# Patient Record
Sex: Female | Born: 1960
Health system: Southern US, Community
[De-identification: ages and names within clinical notes are randomized; demographics above are authoritative.]

## PROBLEM LIST (undated history)

## (undated) DIAGNOSIS — E785 Hyperlipidemia, unspecified: Secondary | ICD-10-CM

## (undated) DIAGNOSIS — K219 Gastro-esophageal reflux disease without esophagitis: Secondary | ICD-10-CM

## (undated) DIAGNOSIS — I10 Essential (primary) hypertension: Secondary | ICD-10-CM

## (undated) DIAGNOSIS — J309 Allergic rhinitis, unspecified: Secondary | ICD-10-CM

## (undated) DIAGNOSIS — T7840XA Allergy, unspecified, initial encounter: Secondary | ICD-10-CM

## (undated) HISTORY — DX: Allergic rhinitis, unspecified: J30.9

## (undated) HISTORY — DX: Hyperlipidemia, unspecified: E78.5

## (undated) HISTORY — DX: Essential (primary) hypertension: I10

## (undated) HISTORY — DX: Gastro-esophageal reflux disease without esophagitis: K21.9

## (undated) HISTORY — DX: Allergy, unspecified, initial encounter: T78.40XA

---

## 1987-09-04 HISTORY — PX: ENDOMETRIAL ABLATION: SHX621

## 1998-10-26 ENCOUNTER — Other Ambulatory Visit: Admission: RE | Admit: 1998-10-26 | Discharge: 1998-10-26 | Payer: Self-pay | Admitting: Obstetrics and Gynecology

## 2004-04-18 ENCOUNTER — Other Ambulatory Visit: Admission: RE | Admit: 2004-04-18 | Discharge: 2004-04-18 | Payer: Self-pay | Admitting: Obstetrics and Gynecology

## 2016-02-09 DIAGNOSIS — H60313 Diffuse otitis externa, bilateral: Secondary | ICD-10-CM | POA: Diagnosis not present

## 2016-02-09 DIAGNOSIS — R1013 Epigastric pain: Secondary | ICD-10-CM | POA: Diagnosis not present

## 2016-02-09 DIAGNOSIS — J301 Allergic rhinitis due to pollen: Secondary | ICD-10-CM | POA: Diagnosis not present

## 2016-02-14 DIAGNOSIS — R1013 Epigastric pain: Secondary | ICD-10-CM | POA: Diagnosis not present

## 2016-02-23 DIAGNOSIS — R1013 Epigastric pain: Secondary | ICD-10-CM | POA: Diagnosis not present

## 2017-09-16 DIAGNOSIS — Z23 Encounter for immunization: Secondary | ICD-10-CM | POA: Diagnosis not present

## 2017-10-07 DIAGNOSIS — J189 Pneumonia, unspecified organism: Secondary | ICD-10-CM | POA: Diagnosis not present

## 2017-10-07 DIAGNOSIS — Z6830 Body mass index (BMI) 30.0-30.9, adult: Secondary | ICD-10-CM | POA: Diagnosis not present

## 2017-10-07 DIAGNOSIS — F17210 Nicotine dependence, cigarettes, uncomplicated: Secondary | ICD-10-CM | POA: Diagnosis not present

## 2017-10-10 DIAGNOSIS — T7840XA Allergy, unspecified, initial encounter: Secondary | ICD-10-CM | POA: Diagnosis not present

## 2017-10-10 DIAGNOSIS — J189 Pneumonia, unspecified organism: Secondary | ICD-10-CM | POA: Diagnosis not present

## 2017-10-10 DIAGNOSIS — Z6830 Body mass index (BMI) 30.0-30.9, adult: Secondary | ICD-10-CM | POA: Diagnosis not present

## 2017-11-11 DIAGNOSIS — M25562 Pain in left knee: Secondary | ICD-10-CM | POA: Diagnosis not present

## 2017-11-11 DIAGNOSIS — S6991XA Unspecified injury of right wrist, hand and finger(s), initial encounter: Secondary | ICD-10-CM | POA: Diagnosis not present

## 2017-11-11 DIAGNOSIS — R079 Chest pain, unspecified: Secondary | ICD-10-CM | POA: Diagnosis not present

## 2017-11-11 DIAGNOSIS — S299XXA Unspecified injury of thorax, initial encounter: Secondary | ICD-10-CM | POA: Diagnosis not present

## 2017-11-11 DIAGNOSIS — S8992XA Unspecified injury of left lower leg, initial encounter: Secondary | ICD-10-CM | POA: Diagnosis not present

## 2017-11-11 DIAGNOSIS — M542 Cervicalgia: Secondary | ICD-10-CM | POA: Diagnosis not present

## 2017-11-11 DIAGNOSIS — M79641 Pain in right hand: Secondary | ICD-10-CM | POA: Diagnosis not present

## 2017-11-11 DIAGNOSIS — S199XXA Unspecified injury of neck, initial encounter: Secondary | ICD-10-CM | POA: Diagnosis not present

## 2017-11-11 DIAGNOSIS — S6292XA Unspecified fracture of left wrist and hand, initial encounter for closed fracture: Secondary | ICD-10-CM | POA: Diagnosis not present

## 2017-11-11 DIAGNOSIS — S62347A Nondisplaced fracture of base of fifth metacarpal bone. left hand, initial encounter for closed fracture: Secondary | ICD-10-CM | POA: Diagnosis not present

## 2017-11-11 DIAGNOSIS — S62306A Unspecified fracture of fifth metacarpal bone, right hand, initial encounter for closed fracture: Secondary | ICD-10-CM | POA: Diagnosis not present

## 2017-11-12 DIAGNOSIS — S63501A Unspecified sprain of right wrist, initial encounter: Secondary | ICD-10-CM | POA: Diagnosis not present

## 2017-11-12 DIAGNOSIS — S62347A Nondisplaced fracture of base of fifth metacarpal bone. left hand, initial encounter for closed fracture: Secondary | ICD-10-CM | POA: Diagnosis not present

## 2017-12-10 DIAGNOSIS — S62347A Nondisplaced fracture of base of fifth metacarpal bone. left hand, initial encounter for closed fracture: Secondary | ICD-10-CM | POA: Diagnosis not present

## 2017-12-10 DIAGNOSIS — S63501A Unspecified sprain of right wrist, initial encounter: Secondary | ICD-10-CM | POA: Diagnosis not present

## 2017-12-11 DIAGNOSIS — M25632 Stiffness of left wrist, not elsewhere classified: Secondary | ICD-10-CM | POA: Diagnosis not present

## 2017-12-11 DIAGNOSIS — M79642 Pain in left hand: Secondary | ICD-10-CM | POA: Diagnosis not present

## 2017-12-11 DIAGNOSIS — M25531 Pain in right wrist: Secondary | ICD-10-CM | POA: Diagnosis not present

## 2017-12-31 DIAGNOSIS — S62347A Nondisplaced fracture of base of fifth metacarpal bone. left hand, initial encounter for closed fracture: Secondary | ICD-10-CM | POA: Diagnosis not present

## 2017-12-31 DIAGNOSIS — S63501A Unspecified sprain of right wrist, initial encounter: Secondary | ICD-10-CM | POA: Diagnosis not present

## 2018-01-02 DIAGNOSIS — R600 Localized edema: Secondary | ICD-10-CM | POA: Diagnosis not present

## 2018-01-02 DIAGNOSIS — M79644 Pain in right finger(s): Secondary | ICD-10-CM | POA: Diagnosis not present

## 2018-01-03 DIAGNOSIS — S60221A Contusion of right hand, initial encounter: Secondary | ICD-10-CM | POA: Diagnosis not present

## 2018-01-21 DIAGNOSIS — S62347A Nondisplaced fracture of base of fifth metacarpal bone. left hand, initial encounter for closed fracture: Secondary | ICD-10-CM | POA: Diagnosis not present

## 2018-02-24 DIAGNOSIS — J181 Lobar pneumonia, unspecified organism: Secondary | ICD-10-CM | POA: Diagnosis not present

## 2018-02-24 DIAGNOSIS — Z6832 Body mass index (BMI) 32.0-32.9, adult: Secondary | ICD-10-CM | POA: Diagnosis not present

## 2018-03-04 DIAGNOSIS — Z87891 Personal history of nicotine dependence: Secondary | ICD-10-CM | POA: Diagnosis not present

## 2018-03-04 DIAGNOSIS — J180 Bronchopneumonia, unspecified organism: Secondary | ICD-10-CM | POA: Diagnosis not present

## 2018-03-04 DIAGNOSIS — Z6832 Body mass index (BMI) 32.0-32.9, adult: Secondary | ICD-10-CM | POA: Diagnosis not present

## 2018-03-04 DIAGNOSIS — R05 Cough: Secondary | ICD-10-CM | POA: Diagnosis not present

## 2018-03-04 DIAGNOSIS — R5383 Other fatigue: Secondary | ICD-10-CM | POA: Diagnosis not present

## 2018-03-04 DIAGNOSIS — S62347A Nondisplaced fracture of base of fifth metacarpal bone. left hand, initial encounter for closed fracture: Secondary | ICD-10-CM | POA: Diagnosis not present

## 2018-03-04 DIAGNOSIS — K219 Gastro-esophageal reflux disease without esophagitis: Secondary | ICD-10-CM | POA: Diagnosis not present

## 2018-03-04 DIAGNOSIS — J301 Allergic rhinitis due to pollen: Secondary | ICD-10-CM | POA: Diagnosis not present

## 2018-03-11 DIAGNOSIS — K219 Gastro-esophageal reflux disease without esophagitis: Secondary | ICD-10-CM | POA: Diagnosis not present

## 2018-03-11 DIAGNOSIS — J301 Allergic rhinitis due to pollen: Secondary | ICD-10-CM | POA: Diagnosis not present

## 2018-03-11 DIAGNOSIS — J180 Bronchopneumonia, unspecified organism: Secondary | ICD-10-CM | POA: Diagnosis not present

## 2018-03-11 DIAGNOSIS — R5383 Other fatigue: Secondary | ICD-10-CM | POA: Diagnosis not present

## 2018-08-19 DIAGNOSIS — J329 Chronic sinusitis, unspecified: Secondary | ICD-10-CM | POA: Diagnosis not present

## 2018-08-19 DIAGNOSIS — R5383 Other fatigue: Secondary | ICD-10-CM | POA: Diagnosis not present

## 2018-08-19 DIAGNOSIS — Z79899 Other long term (current) drug therapy: Secondary | ICD-10-CM | POA: Diagnosis not present

## 2018-08-19 DIAGNOSIS — I10 Essential (primary) hypertension: Secondary | ICD-10-CM | POA: Diagnosis not present

## 2018-08-19 DIAGNOSIS — J9801 Acute bronchospasm: Secondary | ICD-10-CM | POA: Diagnosis not present

## 2018-08-19 DIAGNOSIS — Z6834 Body mass index (BMI) 34.0-34.9, adult: Secondary | ICD-10-CM | POA: Diagnosis not present

## 2018-08-19 DIAGNOSIS — E785 Hyperlipidemia, unspecified: Secondary | ICD-10-CM | POA: Diagnosis not present

## 2018-11-20 DIAGNOSIS — J40 Bronchitis, not specified as acute or chronic: Secondary | ICD-10-CM | POA: Diagnosis not present

## 2018-11-20 DIAGNOSIS — Z6832 Body mass index (BMI) 32.0-32.9, adult: Secondary | ICD-10-CM | POA: Diagnosis not present

## 2018-11-20 DIAGNOSIS — I10 Essential (primary) hypertension: Secondary | ICD-10-CM | POA: Diagnosis not present

## 2018-11-20 DIAGNOSIS — J329 Chronic sinusitis, unspecified: Secondary | ICD-10-CM | POA: Diagnosis not present

## 2019-02-27 DIAGNOSIS — R300 Dysuria: Secondary | ICD-10-CM | POA: Diagnosis not present

## 2019-06-17 DIAGNOSIS — I10 Essential (primary) hypertension: Secondary | ICD-10-CM | POA: Diagnosis not present

## 2019-06-17 DIAGNOSIS — Z23 Encounter for immunization: Secondary | ICD-10-CM | POA: Diagnosis not present

## 2019-06-17 DIAGNOSIS — M25549 Pain in joints of unspecified hand: Secondary | ICD-10-CM | POA: Diagnosis not present

## 2019-06-17 DIAGNOSIS — N952 Postmenopausal atrophic vaginitis: Secondary | ICD-10-CM | POA: Diagnosis not present

## 2019-06-17 DIAGNOSIS — R300 Dysuria: Secondary | ICD-10-CM | POA: Diagnosis not present

## 2019-06-17 DIAGNOSIS — K921 Melena: Secondary | ICD-10-CM | POA: Diagnosis not present

## 2019-06-17 DIAGNOSIS — E782 Mixed hyperlipidemia: Secondary | ICD-10-CM | POA: Diagnosis not present

## 2019-06-18 DIAGNOSIS — R946 Abnormal results of thyroid function studies: Secondary | ICD-10-CM | POA: Diagnosis not present

## 2019-07-22 DIAGNOSIS — E039 Hypothyroidism, unspecified: Secondary | ICD-10-CM | POA: Diagnosis not present

## 2019-07-22 DIAGNOSIS — E782 Mixed hyperlipidemia: Secondary | ICD-10-CM | POA: Diagnosis not present

## 2019-07-22 DIAGNOSIS — I10 Essential (primary) hypertension: Secondary | ICD-10-CM | POA: Diagnosis not present

## 2019-07-22 DIAGNOSIS — N952 Postmenopausal atrophic vaginitis: Secondary | ICD-10-CM | POA: Diagnosis not present

## 2019-10-01 DIAGNOSIS — I10 Essential (primary) hypertension: Secondary | ICD-10-CM | POA: Diagnosis not present

## 2019-10-01 DIAGNOSIS — E782 Mixed hyperlipidemia: Secondary | ICD-10-CM | POA: Diagnosis not present

## 2019-10-01 DIAGNOSIS — R0981 Nasal congestion: Secondary | ICD-10-CM | POA: Diagnosis not present

## 2019-10-01 DIAGNOSIS — R519 Headache, unspecified: Secondary | ICD-10-CM | POA: Diagnosis not present

## 2019-10-01 DIAGNOSIS — N952 Postmenopausal atrophic vaginitis: Secondary | ICD-10-CM | POA: Diagnosis not present

## 2019-10-01 DIAGNOSIS — U071 COVID-19: Secondary | ICD-10-CM | POA: Diagnosis not present

## 2019-10-15 ENCOUNTER — Other Ambulatory Visit: Payer: Self-pay | Admitting: Family Medicine

## 2019-10-15 MED ORDER — AMLODIPINE BESYLATE 5 MG PO TABS: 5.0000 mg | ORAL_TABLET | Freq: Every day | ORAL | 3 refills | Status: DC

## 2019-10-15 MED ORDER — ROSUVASTATIN CALCIUM 20 MG PO TABS: 20.0000 mg | ORAL_TABLET | Freq: Every day | ORAL | 0 refills | Status: DC

## 2019-10-15 NOTE — Telephone Encounter (Signed)
Caroline Kelly called to report that she completed quarantine from covid on Sunday.  Since Sunday she has been having issues with her blood pressure.  Her face feels flushed and she has some mild headache.  Her bp yesterday was 157/80 and then 129/59.  Today her bp is 149/83.  She also continues to have some congested but no fever or cough.  She cannot smell or taste since covid started.  She is concerned about her bp

## 2019-10-15 NOTE — Telephone Encounter (Signed)
Recommend continue lisinopril.hctz at current dose. Sending amlodipine 5 mg once daily for her uncontrolled bp.  . Sending crestor also.  Pt needs a follow up for her bp and fasting labwork in 1-2 weeks.

## 2019-10-19 ENCOUNTER — Encounter: Payer: Self-pay | Admitting: Family Medicine

## 2019-10-19 ENCOUNTER — Other Ambulatory Visit: Payer: Self-pay

## 2019-10-19 ENCOUNTER — Ambulatory Visit: Payer: BC Managed Care – PPO | Admitting: Family Medicine

## 2019-10-19 VITALS — BP 130/82 | HR 64 | Temp 97.9°F | Resp 16 | Ht 63.0 in | Wt 191.8 lb

## 2019-10-19 DIAGNOSIS — I10 Essential (primary) hypertension: Secondary | ICD-10-CM | POA: Diagnosis not present

## 2019-10-19 DIAGNOSIS — E038 Other specified hypothyroidism: Secondary | ICD-10-CM | POA: Diagnosis not present

## 2019-10-19 DIAGNOSIS — E782 Mixed hyperlipidemia: Secondary | ICD-10-CM | POA: Diagnosis not present

## 2019-10-19 NOTE — Assessment & Plan Note (Signed)
Checking lab.

## 2019-10-19 NOTE — Assessment & Plan Note (Signed)
Check labs.  Low fat diet and exercise recommended Continue crestor.

## 2019-10-19 NOTE — Assessment & Plan Note (Signed)
Intermittently uncontrolled. Recommend low salt diet. Recommend start amlodipine 5 mg 1/2 daily. Check bp daily. If continues to be elevated, recommend increase to 5 mg daily.

## 2019-10-19 NOTE — Patient Instructions (Signed)
Essential hypertension, benign Intermittently uncontrolled. Recommend low salt diet. Recommend start amlodipine 5 mg 1/2 daily. Check bp daily. If continues to be elevated, recommend increase to 5 mg daily.  Secondary hypothyroidism Checking lab.  Mixed hyperlipidemia Check labs.  Low fat diet and exercise recommended Continue crestor.   Fat and Cholesterol Restricted Eating Plan Getting too much fat and cholesterol in your diet may cause health problems. Choosing the right foods helps keep your fat and cholesterol at normal levels. This can keep you from getting certain diseases. Your doctor may recommend an eating plan that includes:  Total fat: ______% or less of total calories a day.  Saturated fat: ______% or less of total calories a day.  Cholesterol: less than _________mg a day.  Fiber: ______g a day. What are tips for following this plan? Meal planning  At meals, divide your plate into four equal parts: ? Fill one-half of your plate with vegetables and green salads. ? Fill one-fourth of your plate with whole grains. ? Fill one-fourth of your plate with low-fat (lean) protein foods.  Eat fish that is high in omega-3 fats at least two times a week. This includes mackerel, tuna, sardines, and salmon.  Eat foods that are high in fiber, such as whole grains, beans, apples, broccoli, carrots, peas, and barley. General tips   Work with your doctor to lose weight if you need to.  Avoid: ? Foods with added sugar. ? Fried foods. ? Foods with partially hydrogenated oils.  Limit alcohol intake to no more than 1 drink a day for nonpregnant women and 2 drinks a day for men. One drink equals 12 oz of beer, 5 oz of wine, or 1 oz of hard liquor. Reading food labels  Check food labels for: ? Trans fats. ? Partially hydrogenated oils. ? Saturated fat (g) in each serving. ? Cholesterol (mg) in each serving. ? Fiber (g) in each serving.  Choose foods with healthy fats, such  as: ? Monounsaturated fats. ? Polyunsaturated fats. ? Omega-3 fats.  Choose grain products that have whole grains. Look for the word "whole" as the first word in the ingredient list. Cooking  Cook foods using low-fat methods. These include baking, boiling, grilling, and broiling.  Eat more home-cooked foods. Eat at restaurants and buffets less often.  Avoid cooking using saturated fats, such as butter, cream, palm oil, palm kernel oil, and coconut oil. Recommended foods  Fruits  All fresh, canned (in natural juice), or frozen fruits. Vegetables  Fresh or frozen vegetables (raw, steamed, roasted, or grilled). Green salads. Grains  Whole grains, such as whole wheat or whole grain breads, crackers, cereals, and pasta. Unsweetened oatmeal, bulgur, barley, quinoa, or brown rice. Corn or whole wheat flour tortillas. Meats and other protein foods  Ground beef (85% or leaner), grass-fed beef, or beef trimmed of fat. Skinless chicken or [place]. Ground chicken or [place]. Pork trimmed of fat. All fish and seafood. Egg whites. Dried beans, peas, or lentils. Unsalted nuts or seeds. Unsalted canned beans. Nut butters without added sugar or oil. Dairy  Low-fat or nonfat dairy products, such as skim or 1% milk, 2% or reduced-fat cheeses, low-fat and fat-free ricotta or cottage cheese, or plain low-fat and nonfat yogurt. Fats and oils  Tub margarine without trans fats. Light or reduced-fat mayonnaise and salad dressings. Avocado. Olive, canola, sesame, or safflower oils. The items listed above may not be a complete list of foods and beverages you can eat. Contact a dietitian for more information.  Foods to avoid Fruits  Canned fruit in heavy syrup. Fruit in cream or butter sauce. Fried fruit. Vegetables  Vegetables cooked in cheese, cream, or butter sauce. Fried vegetables. Grains  White bread. White pasta. White rice. Cornbread. Bagels, pastries, and croissants. Crackers and snack foods  that contain trans fat and hydrogenated oils. Meats and other protein foods  Fatty cuts of meat. Ribs, chicken wings, bacon, sausage, bologna, salami, chitterlings, fatback, hot dogs, bratwurst, and packaged lunch meats. Liver and organ meats. Whole eggs and egg yolks. Chicken and [place] with skin. Fried meat. Dairy  Whole or 2% milk, cream, half-and-half, and cream cheese. Whole milk cheeses. Whole-fat or sweetened yogurt. Full-fat cheeses. Nondairy creamers and whipped toppings. Processed cheese, cheese spreads, and cheese curds. Beverages  Alcohol. Sugar-sweetened drinks such as sodas, lemonade, and fruit drinks. Fats and oils  Butter, stick margarine, lard, shortening, ghee, or bacon fat. Coconut, palm kernel, and palm oils. Sweets and desserts  Corn syrup, sugars, honey, and molasses. Candy. Jam and jelly. Syrup. Sweetened cereals. Cookies, pies, cakes, donuts, muffins, and ice cream. The items listed above may not be a complete list of foods and beverages you should avoid. Contact a dietitian for more information. Summary  Choosing the right foods helps keep your fat and cholesterol at normal levels. This can keep you from getting certain diseases.  At meals, fill one-half of your plate with vegetables and green salads.  Eat high-fiber foods, like whole grains, beans, apples, carrots, peas, and barley.  Limit added sugar, saturated fats, alcohol, and fried foods. This information is not intended to replace advice given to you by your health care provider. Make sure you discuss any questions you have with your health care provider. Document Revised: 04/23/2018 Document Reviewed: 05/07/2017 Elsevier Patient Education  [employer].  Hypertension, Adult Hypertension is another name for high blood pressure. High blood pressure forces your heart to work harder to pump blood. This can cause problems over time. There are two numbers in a blood pressure reading. There is a top  number (systolic) over a bottom number (diastolic). It is best to have a blood pressure that is below 120/80. Healthy choices can help lower your blood pressure, or you may need medicine to help lower it. What are the causes? The cause of this condition is not known. Some conditions may be related to high blood pressure. What increases the risk?  Smoking.  Having type 2 diabetes mellitus, high cholesterol, or both.  Not getting enough exercise or physical activity.  Being overweight.  Having too much fat, sugar, calories, or salt (sodium) in your diet.  Drinking too much alcohol.  Having long-term (chronic) kidney disease.  Having a family history of high blood pressure.  Age. Risk increases with age.  Race. You may be at higher risk if you are African American.  Gender. Men are at higher risk than women before age 61. After age 66, women are at higher risk than men.  Having obstructive sleep apnea.  Stress. What are the signs or symptoms?  High blood pressure may not cause symptoms. Very high blood pressure (hypertensive crisis) may cause: ? Headache. ? Feelings of worry or nervousness (anxiety). ? Shortness of breath. ? Nosebleed. ? A feeling of being sick to your stomach (nausea). ? Throwing up (vomiting). ? Changes in how you see. ? Very bad chest pain. ? Seizures. How is this treated?  This condition is treated by making healthy lifestyle changes, such as: ? Eating healthy  foods. ? Exercising more. ? Drinking less alcohol.  Your health care provider may prescribe medicine if lifestyle changes are not enough to get your blood pressure under control, and if: ? Your top number is above 130. ? Your bottom number is above 80.  Your personal target blood pressure may vary. Follow these instructions at home: Eating and drinking   If told, follow the DASH eating plan. To follow this plan: ? Fill one half of your plate at each meal with fruits and  vegetables. ? Fill one fourth of your plate at each meal with whole grains. Whole grains include whole-wheat pasta, brown rice, and whole-grain bread. ? Eat or drink low-fat dairy products, such as skim milk or low-fat yogurt. ? Fill one fourth of your plate at each meal with low-fat (lean) proteins. Low-fat proteins include fish, chicken without skin, eggs, beans, and tofu. ? Avoid fatty meat, cured and processed meat, or chicken with skin. ? Avoid pre-made or processed food.  Eat less than 1,500 mg of salt each day.  Do not drink alcohol if: ? Your doctor tells you not to drink. ? You are pregnant, may be pregnant, or are planning to become pregnant.  If you drink alcohol: ? Limit how much you use to:  0-1 drink a day for women.  0-2 drinks a day for men. ? Be aware of how much alcohol is in your drink. In the [place] one drink equals one 12 oz bottle of beer (355 mL), one 5 oz glass of wine (148 mL), or one 1 oz glass of hard liquor (44 mL). Lifestyle   Work with your doctor to stay at a healthy weight or to lose weight. Ask your doctor what the best weight is for you.  Get at least 30 minutes of exercise most days of the week. This may include walking, swimming, or biking.  Get at least 30 minutes of exercise that strengthens your muscles (resistance exercise) at least 3 days a week. This may include lifting weights or doing Pilates.  Do not use any products that contain nicotine or tobacco, such as cigarettes, e-cigarettes, and chewing tobacco. If you need help quitting, ask your doctor.  Check your blood pressure at home as told by your doctor.  Keep all follow-up visits as told by your doctor. This is important. Medicines  Take over-the-counter and prescription medicines only as told by your doctor. Follow directions carefully.  Do not skip doses of blood pressure medicine. The medicine does not work as well if you skip doses. Skipping doses also puts you at risk for  problems.  Ask your doctor about side effects or reactions to medicines that you should watch for. Contact a doctor if you:  Think you are having a reaction to the medicine you are taking.  Have headaches that keep coming back (recurring).  Feel dizzy.  Have swelling in your ankles.  Have trouble with your vision. Get help right away if you:  Get a very bad headache.  Start to feel mixed up (confused).  Feel weak or numb.  Feel faint.  Have very bad pain in your: ? Chest. ? Belly (abdomen).  Throw up more than once.  Have trouble breathing. Summary  Hypertension is another name for high blood pressure.  High blood pressure forces your heart to work harder to pump blood.  For most people, a normal blood pressure is less than 120/80.  Making healthy choices can help lower blood pressure. If your blood  blood pressure does not get lower with healthy choices, you may need to take medicine. This information is not intended to replace advice given to you by your health care provider. Make sure you discuss any questions you have with your health care provider. Document Revised: 04/30/2018 Document Reviewed: 04/30/2018 Elsevier Patient Education  2020 Else[employer] Inc.  

## 2019-10-19 NOTE — Progress Notes (Signed)
Subjective:  Patient ID: Caroline Kelly, female    DOB: 02-Oct-1960  Age: 59 y.o. MRN: [id number]  Chief Complaint  Patient presents with  . Hypertension  . Bronchitis    HPI  Patient is a 59 year old white female with history of hypertension who presents for follow-up after changing her medications.  Currently taking lisinopril/HCT 20-12.5 mg twice daily.  Her blood pressure varies but when it goes into the 150s over 90s she develops a headache and a "funny feeling.  "She does also develop some shortness of breath. In addition the patient is recovering from COVID-19 bronchitis.  She did get the infusion treatment in [place] which has significantly helped.  She does not feel she is having any symptoms remaining from the COVID-19 other than perhaps some fatigue.  Social Hx   Social History   Socioeconomic History  . Marital status: Married    Spouse name: Not on file  . Number of children: Not on file  . Years of education: Not on file  . Highest education level: Not on file  Occupational History  . Not on file  Tobacco Use  . Smoking status: Former Smoker    Quit date: 09/2017    Years since quitting: 2.1  . Smokeless tobacco: Never Used  Substance and Sexual Activity  . Alcohol use: Yes    Comment: twice monthly  . Drug use: Not on file  . Sexual activity: Not on file  Other Topics Concern  . Not on file  Social History Narrative  . Not on file   Social Determinants of Health   Financial Resource Strain:   . Difficulty of Paying Living Expenses: Not on file  Food Insecurity:   . Worried About Charity fundraiser in the Last Year: Not on file  . Ran Out of Food in the Last Year: Not on file  Transportation Needs:   . Lack of Transportation (Medical): Not on file  . Lack of Transportation (Non-Medical): Not on file  Physical Activity:   . Days of Exercise per Week: Not on file  . Minutes of Exercise per Session: Not on file  Stress:   . Feeling of Stress : Not  on file  Social Connections:   . Frequency of Communication with Friends and Family: Not on file  . Frequency of Social Gatherings with Friends and Family: Not on file  . Attends Religious Services: Not on file  . Active Member of Clubs or Organizations: Not on file  . Attends Archivist Meetings: Not on file  . Marital Status: Not on file   Past Medical History:  Diagnosis Date  . Allergy   . GERD (gastroesophageal reflux disease)   . Hyperlipidemia   . Hypertension    The patient has a family history of  Review of Systems Review of Systems  Constitutional: Negative for chills, diaphoresis, fever and malaise/fatigue.  HENT: Negative for congestion, ear pain and sore throat.   Respiratory: Negative for cough and sputum production.  She does have occasional dyspnea with elevation of her bp.  Cardiovascular: Negative for chest pain and palpitations.  Gastrointestinal: Negative for abdominal pain, constipation, diarrhea, nausea and vomiting.  Neurological: Negative for dizziness. Develops headaches when her bp is up.     Objective:  BP 130/82 (BP Location: Left Arm, Patient Position: Sitting, Cuff Size: Normal)   Pulse 64   Temp 97.9 F (36.6 C)   Resp 16   Ht 5\' 3"  (1.6 m)  Wt 191 lb 12.8 oz (87 kg)   BMI 33.98 kg/m   BP/Weight 10/19/2019  Systolic BP 130  Diastolic BP 82  Wt. (Lbs) 191.8  BMI 33.98   Physical Exam Vitals reviewed.  Constitutional:      Appearance: Normal appearance.  Cardiovascular:     Rate and Rhythm: Normal rate and regular rhythm.     Pulses: Normal pulses.     Heart sounds: Normal heart sounds.  Pulmonary:     Effort: Pulmonary effort is normal.     Breath sounds: Normal breath sounds. No wheezing, rhonchi or rales.  Abdominal:     General: Bowel sounds are normal.     Palpations: Abdomen is soft.     Tenderness: There is no abdominal tenderness.  Neurological:     Mental Status: ALERT Psychiatric:        Mood and Affect:  Mood normal.        Behavior: Behavior normal.    Lab Results  Component Value Date   WBC 5.3 10/19/2019   HGB 14.0 10/19/2019   HCT 40.8 10/19/2019   PLT 221 10/19/2019   GLUCOSE 83 10/19/2019   CHOL 160 10/19/2019   TRIG 138 10/19/2019   HDL 47 10/19/2019   LDLCALC 89 10/19/2019   AST 30 10/19/2019   NA 141 10/19/2019   K 3.8 10/19/2019   CL 100 10/19/2019   CREATININE 0.85 10/19/2019   BUN 17 10/19/2019   TSH 2.050 10/19/2019      Assessment & Plan:   Problem List Items Addressed This Visit      Cardiovascular and Mediastinum   Essential hypertension, benign - Primary    Intermittently uncontrolled. Recommend low salt diet. Recommend start amlodipine 5 mg 1/2 daily. Check bp daily. If continues to be elevated, recommend increase to 5 mg daily.      Relevant Medications   lisinopril-hydrochlorothiazide (ZESTORETIC) 20-12.5 MG tablet   Other Relevant Orders   CBC with Differential/Platelet (Completed)   Comp. Metabolic Panel (12) (Completed)     Endocrine   Secondary hypothyroidism    Checking lab.      Relevant Orders   TSH (Completed)     Other   Mixed hyperlipidemia    Check labs.  Low fat diet and exercise recommended Continue crestor.       Relevant Medications   lisinopril-hydrochlorothiazide (ZESTORETIC) 20-12.5 MG tablet   Other Relevant Orders   Lipid panel (Completed)      Essential hypertension, benign Intermittently uncontrolled. Recommend low salt diet. Recommend start amlodipine 5 mg 1/2 daily. Check bp daily. If continues to be elevated, recommend increase to 5 mg daily.  Secondary hypothyroidism Checking lab.  Mixed hyperlipidemia Check labs.  Low fat diet and exercise recommended Continue crestor.    No orders of the defined types were placed in this encounter.   Follow-up: Return in about 6 weeks (around 11/30/2019) for hypertension.  Blane Ohara [facility] [phone]

## 2019-10-20 LAB — CBC WITH DIFFERENTIAL/PLATELET
Basophils Absolute: 0.1 10*3/uL (ref 0.0–0.2)
Basos: 1 %
EOS (ABSOLUTE): 0.2 10*3/uL (ref 0.0–0.4)
Eos: 4 %
Hematocrit: 40.8 % (ref 34.0–46.6)
Hemoglobin: 14 g/dL (ref 11.1–15.9)
Immature Grans (Abs): 0 10*3/uL (ref 0.0–0.1)
Immature Granulocytes: 0 %
Lymphocytes Absolute: 2 10*3/uL (ref 0.7–3.1)
Lymphs: 38 %
MCH: 30.3 pg (ref 26.6–33.0)
MCHC: 34.3 g/dL (ref 31.5–35.7)
MCV: 88 fL (ref 79–97)
Monocytes Absolute: 0.5 10*3/uL (ref 0.1–0.9)
Monocytes: 10 %
Neutrophils Absolute: 2.4 10*3/uL (ref 1.4–7.0)
Neutrophils: 47 %
Platelets: 221 10*3/uL (ref 150–450)
RBC: 4.62 x10E6/uL (ref 3.77–5.28)
RDW: 13.5 % (ref 11.7–15.4)
WBC: 5.3 10*3/uL (ref 3.4–10.8)

## 2019-10-20 LAB — COMP. METABOLIC PANEL (12)
AST: 30 IU/L (ref 0–40)
Albumin/Globulin Ratio: 2 (ref 1.2–2.2)
Albumin: 4.5 g/dL (ref 3.8–4.9)
Alkaline Phosphatase: 67 IU/L (ref 39–117)
BUN/Creatinine Ratio: 20 (ref 9–23)
BUN: 17 mg/dL (ref 6–24)
Bilirubin Total: 0.4 mg/dL (ref 0.0–1.2)
Calcium: 9.7 mg/dL (ref 8.7–10.2)
Chloride: 100 mmol/L (ref 96–106)
Creatinine, Ser: 0.85 mg/dL (ref 0.57–1.00)
GFR calc Af Amer: 88 mL/min/{1.73_m2} (ref >59)
GFR calc non Af Amer: 76 mL/min/{1.73_m2} (ref >59)
Globulin, Total: 2.3 g/dL (ref 1.5–4.5)
Glucose: 83 mg/dL (ref 65–99)
Potassium: 3.8 mmol/L (ref 3.5–5.2)
Sodium: 141 mmol/L (ref 134–144)
Total Protein: 6.8 g/dL (ref 6.0–8.5)

## 2019-10-20 LAB — LIPID PANEL
Chol/HDL Ratio: 3.4 ratio (ref 0.0–4.4)
Cholesterol, Total: 160 mg/dL (ref 100–199)
HDL: 47 mg/dL (ref >39)
LDL Chol Calc (NIH): 89 mg/dL (ref 0–99)
Triglycerides: 138 mg/dL (ref 0–149)
VLDL Cholesterol Cal: 24 mg/dL (ref 5–40)

## 2019-10-20 LAB — TSH: TSH: 2.05 u[IU]/mL (ref 0.450–4.500)

## 2019-10-20 LAB — CARDIOVASCULAR RISK ASSESSMENT

## 2019-10-21 ENCOUNTER — Other Ambulatory Visit: Payer: Self-pay | Admitting: Family Medicine

## 2019-11-04 ENCOUNTER — Other Ambulatory Visit: Payer: Self-pay | Admitting: Family Medicine

## 2019-11-04 DIAGNOSIS — I10 Essential (primary) hypertension: Secondary | ICD-10-CM

## 2019-11-27 ENCOUNTER — Ambulatory Visit: Payer: BC Managed Care – PPO | Admitting: Family Medicine

## 2019-12-15 NOTE — Progress Notes (Signed)
Established Patient Office Visit  Subjective:  Patient ID: Caroline Kelly, female    DOB: [birth year]  Age: 59 y.o. MRN: [id number]  CC:  Chief Complaint  Patient presents with  . Hypertension    Patient forgot to take her B/P medication this a.m.    HPI Caroline Kelly presents for hypertension. Running 120-130s/70s. Denies headaches, chest pain. Took amlodipine 5 mg daily, but bp improved and stopped it. Eating low carb diet, but has not been able to lose weight. Exercising - housework: Therapist, sports. Having a lot of muscle pain. Had been taking nsaids during covid 19 and stopped them. All extremities ache by the end of the day. Joints hurt. Very stiff. Restarted ibuprofen 200 mg 2 in am and 2 in pm. Helps, but does not resolve the pain. Stopped crestor.  Past Medical History:  Diagnosis Date  . Allergy   . GERD (gastroesophageal reflux disease)   . Hyperlipidemia   . Hypertension     Past Surgical History:  Procedure Laterality Date  . CESAREAN SECTION    . ENDOMETRIAL ABLATION  1989    Family History  Problem Relation Age of Onset  . Heart failure Mother   . Dementia Mother   . Hyperlipidemia Father   . Hypertension Father   . CAD Father   . Hypertension Sister   . Hypertension Brother     Social History   Socioeconomic History  . Marital status: Married    Spouse name: Not on file  . Number of children: Not on file  . Years of education: Not on file  . Highest education level: Not on file  Occupational History  . Not on file  Tobacco Use  . Smoking status: Former Smoker    Quit date: 09/2017    Years since quitting: 2.3  . Smokeless tobacco: Never Used  Substance and Sexual Activity  . Alcohol use: Yes    Comment: twice monthly  . Drug use: Not on file  . Sexual activity: Not on file  Other Topics Concern  . Not on file  Social History Narrative  . Not on file   Social Determinants of Health   Financial Resource Strain:   . Difficulty of Paying  Living Expenses:   Food Insecurity:   . Worried About Programme researcher, broadcasting/film/video in the Last Year:   . Barista in the Last Year:   Transportation Needs:   . Freight forwarder (Medical):   Marland Kitchen Lack of Transportation (Non-Medical):   Physical Activity:   . Days of Exercise per Week:   . Minutes of Exercise per Session:   Stress:   . Feeling of Stress :   Social Connections:   . Frequency of Communication with Friends and Family:   . Frequency of Social Gatherings with Friends and Family:   . Attends Religious Services:   . Active Member of Clubs or Organizations:   . Attends Banker Meetings:   Marland Kitchen Marital Status:   Intimate Partner Violence:   . Fear of Current or Ex-Partner:   . Emotionally Abused:   Marland Kitchen Physically Abused:   . Sexually Abused:     Outpatient Medications Prior to Visit  Medication Sig Dispense Refill  . amLODipine (NORVASC) 5 MG tablet Take 1 tablet (5 mg total) by mouth daily. 90 tablet 3  . fluticasone (FLONASE) 50 MCG/ACT nasal spray Place 1 spray into both nostrils 2 (two) times daily.    Marland Kitchen levothyroxine (  SYNTHROID) 25 MCG tablet TAKE ONE TABLET BY MOUTH ONCE DAILY. 90 tablet 3  . lisinopril-hydrochlorothiazide (ZESTORETIC) 20-12.5 MG tablet TAKE ONE TABLET BY MOUTH TWICE DAILY 180 tablet 1  . PREMARIN vaginal cream SMARTSIG:0.5 Gram(s) Topical Twice a Week    . rosuvastatin (CRESTOR) 20 MG tablet Take 1 tablet (20 mg total) by mouth daily. (Patient not taking: Reported on 12/18/2019) 90 tablet 0  . rosuvastatin (CRESTOR) 20 MG tablet TAKE ONE TABLET BY MOUTH EVERY DAY 90 tablet 0   No facility-administered medications prior to visit.    Allergies  Allergen Reactions  . Levofloxacin Hives    ROS Review of Systems  Constitutional: Positive for fatigue. Negative for chills and fever.  HENT: Negative for congestion, ear pain, rhinorrhea and sore throat.   Respiratory: Negative for cough and shortness of breath.   Cardiovascular: Negative  for chest pain and leg swelling.  Gastrointestinal: Negative for abdominal pain, constipation, diarrhea, nausea and vomiting.  Genitourinary: Negative for dysuria and urgency.  Musculoskeletal: Positive for arthralgias and myalgias (Patient is unsure if her muscle pain is covid related or since starting Crestor (which was started during the time she had Covid) Is taking Advil to help with pain.). Negative for back pain.  Neurological: Negative for dizziness, weakness, light-headedness and headaches.  Psychiatric/Behavioral: Negative for dysphoric mood. The patient is not nervous/anxious.       Objective:    Physical Exam  Constitutional: She appears well-developed and well-nourished.  Cardiovascular: Normal rate, regular rhythm and normal heart sounds.  Pulmonary/Chest: Effort normal and breath sounds normal.  Abdominal: Soft. Bowel sounds are normal. There is no abdominal tenderness.  Musculoskeletal:        General: No deformity or edema. Normal range of motion.     Comments: No FM trigger points positive.   Neurological: She is alert.  Psychiatric: She has a normal mood and affect. Her behavior is normal.    BP 140/86 (BP Location: Left Arm, Patient Position: Sitting)   Pulse 67   Temp (!) 96.7 F (35.9 C) (Temporal)   Ht 5\' 3"  (1.6 m)   Wt 187 lb (84.8 kg)   SpO2 99%   BMI 33.13 kg/m  Wt Readings from Last 3 Encounters:  12/18/19 187 lb (84.8 kg)  10/19/19 191 lb 12.8 oz (87 kg)      Health Maintenance Due  Topic Date Due  . Hepatitis C Screening  Never done  . HIV Screening  Never done  . COVID-19 Vaccine (1) Never done  . TETANUS/TDAP  Never done  . PAP SMEAR-Modifier  Never done  . MAMMOGRAM  Never done  . COLONOSCOPY  Never done    There are no preventive care reminders to display for this patient.  Lab Results  Component Value Date   TSH 2.130 12/18/2019   Lab Results  Component Value Date   WBC 4.6 12/18/2019   HGB 13.8 12/18/2019   HCT 41.4  12/18/2019   MCV 89 12/18/2019   PLT 232 12/18/2019   Lab Results  Component Value Date   NA 140 12/18/2019   K 4.3 12/18/2019   CO2 25 12/18/2019   GLUCOSE 90 12/18/2019   BUN 15 12/18/2019   CREATININE 0.81 12/18/2019   BILITOT 0.4 12/18/2019   ALKPHOS 65 12/18/2019   AST 34 12/18/2019   ALT 31 12/18/2019   PROT 6.8 12/18/2019   ALBUMIN 4.5 12/18/2019   CALCIUM 9.8 12/18/2019   Lab Results  Component Value Date  CHOL 217 (H) 12/18/2019   Lab Results  Component Value Date   HDL 49 12/18/2019   Lab Results  Component Value Date   LDLCALC 141 (H) 12/18/2019   Lab Results  Component Value Date   TRIG 153 (H) 12/18/2019   Lab Results  Component Value Date   CHOLHDL 4.4 12/18/2019   No results found for: HGBA1C    Assessment & Plan:  1. Mixed hyperlipidemia Held crestor due to muscle pain. Her LDL was very high and a statin is very important. Do labs below and then consider restarting or changing statin. - Lipid panel  2. Acquired hypothyroidism Determine if synthroid needs to be changed after labs back. - TSH - T4, Free  3. Essential hypertension, benign Improved and appears to be well controlled at home.   No changes to medicines.  Continue to work on eating a healthy diet and exercise.  Labs drawn today.  - CBC with Differential/Platelet - Comprehensive metabolic panel  4. Pain in other joint - Sedimentation Rate - C-reactive protein - ANA,IFA RA Diag Pnl w/rflx Tit/Patn - Rheumatoid factor - CYCLIC CITRUL PEPTIDE ANTIBODY, IGG/IGA  5. Myalgia - continue to hold crestor for now.  - CK   Follow-up: Return in about 3 months (around 03/18/2020) for fasting.  SOONER BASED ON LABS.   Blane Ohara, MD

## 2019-12-18 ENCOUNTER — Ambulatory Visit: Payer: BC Managed Care – PPO | Admitting: Family Medicine

## 2019-12-18 ENCOUNTER — Encounter: Payer: Self-pay | Admitting: Family Medicine

## 2019-12-18 ENCOUNTER — Other Ambulatory Visit: Payer: Self-pay

## 2019-12-18 VITALS — BP 140/86 | HR 67 | Temp 96.7°F | Ht 63.0 in | Wt 187.0 lb

## 2019-12-18 DIAGNOSIS — M791 Myalgia, unspecified site: Secondary | ICD-10-CM | POA: Diagnosis not present

## 2019-12-18 DIAGNOSIS — E039 Hypothyroidism, unspecified: Secondary | ICD-10-CM | POA: Diagnosis not present

## 2019-12-18 DIAGNOSIS — M2559 Pain in other specified joint: Secondary | ICD-10-CM

## 2019-12-18 DIAGNOSIS — E782 Mixed hyperlipidemia: Secondary | ICD-10-CM | POA: Diagnosis not present

## 2019-12-18 DIAGNOSIS — I10 Essential (primary) hypertension: Secondary | ICD-10-CM

## 2019-12-22 LAB — CBC WITH DIFFERENTIAL/PLATELET
Basophils Absolute: 0.1 10*3/uL (ref 0.0–0.2)
Basos: 1 %
EOS (ABSOLUTE): 0.2 10*3/uL (ref 0.0–0.4)
Eos: 4 %
Hematocrit: 41.4 % (ref 34.0–46.6)
Hemoglobin: 13.8 g/dL (ref 11.1–15.9)
Immature Grans (Abs): 0 10*3/uL (ref 0.0–0.1)
Immature Granulocytes: 0 %
Lymphocytes Absolute: 2 10*3/uL (ref 0.7–3.1)
Lymphs: 45 %
MCH: 29.7 pg (ref 26.6–33.0)
MCHC: 33.3 g/dL (ref 31.5–35.7)
MCV: 89 fL (ref 79–97)
Monocytes Absolute: 0.4 10*3/uL (ref 0.1–0.9)
Monocytes: 9 %
Neutrophils Absolute: 1.9 10*3/uL (ref 1.4–7.0)
Neutrophils: 41 %
Platelets: 232 10*3/uL (ref 150–450)
RBC: 4.64 x10E6/uL (ref 3.77–5.28)
RDW: 13.2 % (ref 11.7–15.4)
WBC: 4.6 10*3/uL (ref 3.4–10.8)

## 2019-12-22 LAB — LIPID PANEL
Chol/HDL Ratio: 4.4 ratio (ref 0.0–4.4)
Cholesterol, Total: 217 mg/dL — ABNORMAL HIGH (ref 100–199)
HDL: 49 mg/dL (ref >39)
LDL Chol Calc (NIH): 141 mg/dL — ABNORMAL HIGH (ref 0–99)
Triglycerides: 153 mg/dL — ABNORMAL HIGH (ref 0–149)
VLDL Cholesterol Cal: 27 mg/dL (ref 5–40)

## 2019-12-22 LAB — COMPREHENSIVE METABOLIC PANEL
ALT: 31 IU/L (ref 0–32)
AST: 34 IU/L (ref 0–40)
Albumin/Globulin Ratio: 2 (ref 1.2–2.2)
Albumin: 4.5 g/dL (ref 3.8–4.9)
Alkaline Phosphatase: 65 IU/L (ref 39–117)
BUN/Creatinine Ratio: 19 (ref 9–23)
BUN: 15 mg/dL (ref 6–24)
Bilirubin Total: 0.4 mg/dL (ref 0.0–1.2)
CO2: 25 mmol/L (ref 20–29)
Calcium: 9.8 mg/dL (ref 8.7–10.2)
Chloride: 103 mmol/L (ref 96–106)
Creatinine, Ser: 0.81 mg/dL (ref 0.57–1.00)
GFR calc Af Amer: 93 mL/min/{1.73_m2} (ref >59)
GFR calc non Af Amer: 81 mL/min/{1.73_m2} (ref >59)
Globulin, Total: 2.3 g/dL (ref 1.5–4.5)
Glucose: 90 mg/dL (ref 65–99)
Potassium: 4.3 mmol/L (ref 3.5–5.2)
Sodium: 140 mmol/L (ref 134–144)
Total Protein: 6.8 g/dL (ref 6.0–8.5)

## 2019-12-22 LAB — C-REACTIVE PROTEIN: CRP: 2 mg/L (ref 0–10)

## 2019-12-22 LAB — FANA STAINING PATTERNS: Homogeneous Pattern: 1:80 {titer}

## 2019-12-22 LAB — SEDIMENTATION RATE: Sed Rate: 10 mm/hr (ref 0–40)

## 2019-12-22 LAB — ANA,IFA RA DIAG PNL W/RFLX TIT/PATN
ANA Titer 1: POSITIVE — AB
Cyclic Citrullin Peptide Ab: 2 units (ref 0–19)
Rheumatoid fact SerPl-aCnc: <10 IU/mL (ref 0.0–13.9)

## 2019-12-22 LAB — TSH: TSH: 2.13 u[IU]/mL (ref 0.450–4.500)

## 2019-12-22 LAB — CK: Total CK: 84 U/L (ref 32–182)

## 2019-12-22 LAB — T4, FREE: Free T4: 1.11 ng/dL (ref 0.82–1.77)

## 2019-12-22 LAB — CARDIOVASCULAR RISK ASSESSMENT

## 2020-01-03 ENCOUNTER — Encounter: Payer: Self-pay | Admitting: Family Medicine

## 2020-01-03 DIAGNOSIS — M2550 Pain in unspecified joint: Secondary | ICD-10-CM | POA: Insufficient documentation

## 2020-01-05 ENCOUNTER — Other Ambulatory Visit: Payer: Self-pay

## 2020-01-05 DIAGNOSIS — M2559 Pain in other specified joint: Secondary | ICD-10-CM

## 2020-02-04 ENCOUNTER — Ambulatory Visit: Payer: BC Managed Care – PPO | Admitting: Family Medicine

## 2020-02-04 ENCOUNTER — Encounter: Payer: Self-pay | Admitting: Family Medicine

## 2020-02-04 ENCOUNTER — Other Ambulatory Visit: Payer: Self-pay

## 2020-02-04 VITALS — BP 116/78 | HR 60 | Temp 97.2°F | Resp 16 | Ht 63.0 in | Wt 193.0 lb

## 2020-02-04 DIAGNOSIS — L237 Allergic contact dermatitis due to plants, except food: Secondary | ICD-10-CM

## 2020-02-04 MED ORDER — TRIAMCINOLONE ACETONIDE 0.1 % EX CREA: 1.0000 "application " | TOPICAL_CREAM | Freq: Two times a day (BID) | CUTANEOUS | 0 refills | Status: DC

## 2020-02-04 MED ORDER — TRIAMCINOLONE ACETONIDE 40 MG/ML IJ SUSP
60.0000 mg | Freq: Once | INTRAMUSCULAR | Status: AC
  Administered 2020-02-04: 60 mg via INTRAMUSCULAR

## 2020-02-04 NOTE — Patient Instructions (Signed)
Call if flares up again and will send prednisone.

## 2020-02-04 NOTE — Progress Notes (Signed)
Acute Office Visit  Subjective:    Patient ID: Caroline Kelly, female    DOB: [birth year], 59 y.o.   MRN: [id number]  Chief Complaint  Patient presents with  . Rash    HPI Patient is in today for poison ivy for 3 days.  She was gardening and a blister come up afterwards.  She has been using calamine however it is continuing to spread.  She feels like her right arm is swollen and very itchy  Past Medical History:  Diagnosis Date  . Allergy   . GERD (gastroesophageal reflux disease)   . Hyperlipidemia   . Hypertension     Past Surgical History:  Procedure Laterality Date  . CESAREAN SECTION    . ENDOMETRIAL ABLATION  1989    Family History  Problem Relation Age of Onset  . Heart failure Mother   . Dementia Mother   . Hyperlipidemia Father   . Hypertension Father   . CAD Father   . Hypertension Sister   . Hypertension Brother     Social History   Socioeconomic History  . Marital status: Married    Spouse name: Not on file  . Number of children: Not on file  . Years of education: Not on file  . Highest education level: Not on file  Occupational History  . Not on file  Tobacco Use  . Smoking status: Former Smoker    Quit date: 09/2017    Years since quitting: 2.4  . Smokeless tobacco: Never Used  Substance and Sexual Activity  . Alcohol use: Yes    Comment: twice monthly  . Drug use: Not on file  . Sexual activity: Not on file  Other Topics Concern  . Not on file  Social History Narrative  . Not on file   Social Determinants of Health   Financial Resource Strain:   . Difficulty of Paying Living Expenses:   Food Insecurity:   . Worried About Charity fundraiser in the Last Year:   . Arboriculturist in the Last Year:   Transportation Needs:   . Film/video editor (Medical):   Marland Kitchen Lack of Transportation (Non-Medical):   Physical Activity:   . Days of Exercise per Week:   . Minutes of Exercise per Session:   Stress:   . Feeling of Stress :     Social Connections:   . Frequency of Communication with Friends and Family:   . Frequency of Social Gatherings with Friends and Family:   . Attends Religious Services:   . Active Member of Clubs or Organizations:   . Attends Archivist Meetings:   Marland Kitchen Marital Status:   Intimate Partner Violence:   . Fear of Current or Ex-Partner:   . Emotionally Abused:   Marland Kitchen Physically Abused:   . Sexually Abused:     Outpatient Medications Prior to Visit  Medication Sig Dispense Refill  . amLODipine (NORVASC) 5 MG tablet Take 1 tablet (5 mg total) by mouth daily. 90 tablet 3  . fluticasone (FLONASE) 50 MCG/ACT nasal spray Place 1 spray into both nostrils 2 (two) times daily.    Marland Kitchen levothyroxine (SYNTHROID) 25 MCG tablet TAKE ONE TABLET BY MOUTH ONCE DAILY. 90 tablet 3  . lisinopril-hydrochlorothiazide (ZESTORETIC) 20-12.5 MG tablet TAKE ONE TABLET BY MOUTH TWICE DAILY 180 tablet 1  . rosuvastatin (CRESTOR) 20 MG tablet Take 1 tablet (20 mg total) by mouth daily. (Patient not taking: Reported on 12/18/2019) 90 tablet 0  .  PREMARIN vaginal cream SMARTSIG:0.5 Gram(s) Topical Twice a Week     No facility-administered medications prior to visit.    Allergies  Allergen Reactions  . Levofloxacin Hives    Review of Systems  Constitutional: Negative for chills and fever.  HENT: Negative for congestion, ear pain and sore throat.   Respiratory: Negative for cough and shortness of breath.   Cardiovascular: Negative for chest pain.  Skin: Positive for rash.       Objective:    Physical Exam Vitals reviewed.  Constitutional:      Appearance: Normal appearance.  Skin:    Findings: Rash (swollen right medial upper arm. weeping and blistering. Erythema.) present.  Neurological:     Mental Status: She is alert.     BP 116/78   Pulse 60   Temp (!) 97.2 F (36.2 C)   Resp 16   Ht 5\' 3"  (1.6 m)   Wt 193 lb (87.5 kg)   BMI 34.19 kg/m  Wt Readings from Last 3 Encounters:  02/04/20 193  lb (87.5 kg)  12/18/19 187 lb (84.8 kg)  10/19/19 191 lb 12.8 oz (87 kg)    Health Maintenance Due  Topic Date Due  . Hepatitis C Screening  Never done  . COVID-19 Vaccine (1) Never done  . HIV Screening  Never done  . TETANUS/TDAP  Never done  . PAP SMEAR-Modifier  Never done  . MAMMOGRAM  Never done  . COLONOSCOPY  Never done    There are no preventive care reminders to display for this patient.   Lab Results  Component Value Date   TSH 2.130 12/18/2019   Lab Results  Component Value Date   WBC 4.6 12/18/2019   HGB 13.8 12/18/2019   HCT 41.4 12/18/2019   MCV 89 12/18/2019   PLT 232 12/18/2019   Lab Results  Component Value Date   NA 140 12/18/2019   K 4.3 12/18/2019   CO2 25 12/18/2019   GLUCOSE 90 12/18/2019   BUN 15 12/18/2019   CREATININE 0.81 12/18/2019   BILITOT 0.4 12/18/2019   ALKPHOS 65 12/18/2019   AST 34 12/18/2019   ALT 31 12/18/2019   PROT 6.8 12/18/2019   ALBUMIN 4.5 12/18/2019   CALCIUM 9.8 12/18/2019   Lab Results  Component Value Date   CHOL 217 (H) 12/18/2019   Lab Results  Component Value Date   HDL 49 12/18/2019   Lab Results  Component Value Date   LDLCALC 141 (H) 12/18/2019   Lab Results  Component Value Date   TRIG 153 (H) 12/18/2019   Lab Results  Component Value Date   CHOLHDL 4.4 12/18/2019   No results found for: HGBA1C     Assessment & Plan:  1. Poison ivy dermatitis  Follow-up: No follow-ups on file.  An After Visit Summary was printed and given to the patient.  12/20/2019 [facility] [phone]

## 2020-02-26 ENCOUNTER — Telehealth (INDEPENDENT_AMBULATORY_CARE_PROVIDER_SITE_OTHER): Payer: BC Managed Care – PPO | Admitting: Physician Assistant

## 2020-02-26 ENCOUNTER — Encounter: Payer: Self-pay | Admitting: Physician Assistant

## 2020-02-26 VITALS — Temp 97.1°F | Ht 63.0 in | Wt 183.0 lb

## 2020-02-26 DIAGNOSIS — J060 Acute laryngopharyngitis: Secondary | ICD-10-CM | POA: Diagnosis not present

## 2020-02-26 MED ORDER — AZITHROMYCIN 250 MG PO TABS: ORAL_TABLET | ORAL | 0 refills | Status: DC

## 2020-02-26 MED ORDER — FLUTICASONE PROPIONATE 50 MCG/ACT NA SUSP: 1.0000 | Freq: Two times a day (BID) | NASAL | 5 refills | Status: DC

## 2020-02-26 NOTE — Progress Notes (Signed)
Virtual Visit via Telephone Note   This visit type was conducted due to national recommendations for restrictions regarding the COVID-19 Pandemic (e.g. social distancing) in an effort to limit this patient's exposure and mitigate transmission in our community.  Due to her co-morbid illnesses, this patient is at least at moderate risk for complications without adequate follow up.  This format is felt to be most appropriate for this patient at this time.  The patient did not have access to video technology/had technical difficulties with video requiring transitioning to audio format only (telephone).  All issues noted in this document were discussed and addressed.  No physical exam could be performed with this format.  Patient verbally consented to a telehealth visit.   Date:  02/26/2020   ID:  Caroline Kelly, DOB September 26, 1960, MRN [id number]  Patient Location: Home Provider Location: Office  PCP:  Rochel Brome, MD   Evaluation Performed:  Follow-Up Visit  Chief Complaint:  uri  History of Present Illness:    Caroline Kelly is a 59 y.o. female with uri Pt complains that yesterday all of a sudden she began having chills, headache, nasal congestion and cough - unsure if she has had a fever Some malaise Husband with same symptoms  The patient does have symptoms concerning for COVID-19 infection (fever, chills, cough, or new shortness of breath).    Past Medical History:  Diagnosis Date  . Allergy   . GERD (gastroesophageal reflux disease)   . Hyperlipidemia   . Hypertension    Past Surgical History:  Procedure Laterality Date  . CESAREAN SECTION    . ENDOMETRIAL ABLATION  1989     Current Meds  Medication Sig  . fluticasone (FLONASE) 50 MCG/ACT nasal spray Place 1 spray into both nostrils 2 (two) times daily.  Marland Kitchen levothyroxine (SYNTHROID) 25 MCG tablet TAKE ONE TABLET BY MOUTH ONCE DAILY.  Marland Kitchen lisinopril-hydrochlorothiazide (ZESTORETIC) 20-12.5 MG tablet TAKE ONE TABLET BY MOUTH  TWICE DAILY  . triamcinolone cream (KENALOG) 0.1 % Apply 1 application topically 2 (two) times daily.  . [DISCONTINUED] fluticasone (FLONASE) 50 MCG/ACT nasal spray Place 1 spray into both nostrils 2 (two) times daily.     Allergies:   Levofloxacin   Social History   Tobacco Use  . Smoking status: Former Smoker    Quit date: 09/2017    Years since quitting: 2.4  . Smokeless tobacco: Never Used  Substance Use Topics  . Alcohol use: Yes    Comment: twice monthly  . Drug use: Not on file     Family Hx: The patient's family history includes CAD in her father; Dementia in her mother; Heart failure in her mother; Hyperlipidemia in her father; Hypertension in her brother, father, and sister.  ROS:   Please see the history of present illness.    All other systems reviewed and are negative.  Labs/Other Tests and Data Reviewed:    Recent Labs: 12/18/2019: ALT 31; BUN 15; Creatinine, Ser 0.81; Hemoglobin 13.8; Platelets 232; Potassium 4.3; Sodium 140; TSH 2.130   Recent Lipid Panel Lab Results  Component Value Date/Time   CHOL 217 (H) 12/18/2019 11:25 AM   TRIG 153 (H) 12/18/2019 11:25 AM   HDL 49 12/18/2019 11:25 AM   CHOLHDL 4.4 12/18/2019 11:25 AM   LDLCALC 141 (H) 12/18/2019 11:25 AM    Wt Readings from Last 3 Encounters:  02/26/20 183 lb (83 kg)  02/04/20 193 lb (87.5 kg)  12/18/19 187 lb (84.8 kg)  Objective:    Vital Signs:  Temp (!) 97.1 F (36.2 C)   Ht 5\' 3"  (1.6 m)   Wt 183 lb (83 kg)   BMI 32.42 kg/m    VITAL SIGNS:  reviewed  ASSESSMENT & PLAN:    1. URI - rx for zpack and flonase - recommend to take claritin or zyrtec qd - COVID test pending  COVID-19 Education: The signs and symptoms of COVID-19 were discussed with the patient and how to seek care for testing (follow up with PCP or arrange E-visit). The importance of social distancing was discussed today.  Time:   Today, I have spent 10 minutes with the patient with telehealth technology  discussing the above problems.     Medication Adjustments/Labs and Tests Ordered: Current medicines are reviewed at length with the patient today.  Concerns regarding medicines are outlined above.   Tests Ordered: Orders Placed This Encounter  Procedures  . Novel Coronavirus, NAA ([facility])    Medication Changes: Meds ordered this encounter  Medications  . fluticasone (FLONASE) 50 MCG/ACT nasal spray    Sig: Place 1 spray into both nostrils 2 (two) times daily.    Dispense:  8 g    Refill:  5    Order Specific Question:   Supervising Provider    Answer Blane Ohara  . azithromycin (ZITHROMAX) 250 MG tablet    Sig: 2 po day one then 1 po days 2-5    Dispense:  6 tablet    Refill:  0    Order Specific Question:   Supervising Provider    [id number]    Follow Up:  In Person prn  Signed, Corey Harold  02/26/2020 10:48 AM    [facility]

## 2020-03-01 ENCOUNTER — Other Ambulatory Visit: Payer: Self-pay | Admitting: Physician Assistant

## 2020-03-01 ENCOUNTER — Other Ambulatory Visit: Payer: Self-pay

## 2020-03-01 DIAGNOSIS — M791 Myalgia, unspecified site: Secondary | ICD-10-CM

## 2020-03-01 DIAGNOSIS — Z20822 Contact with and (suspected) exposure to covid-19: Secondary | ICD-10-CM

## 2020-03-01 LAB — NOVEL CORONAVIRUS, NAA

## 2020-03-02 LAB — SARS-COV-2, NAA 2 DAY TAT

## 2020-03-02 LAB — NOVEL CORONAVIRUS, NAA: SARS-CoV-2, NAA: NOT DETECTED

## 2020-03-21 ENCOUNTER — Other Ambulatory Visit: Payer: Self-pay

## 2020-03-21 ENCOUNTER — Ambulatory Visit: Payer: BC Managed Care – PPO | Admitting: Family Medicine

## 2020-03-21 VITALS — BP 116/70 | HR 68 | Temp 96.8°F | Resp 16 | Ht 63.0 in | Wt 182.4 lb

## 2020-03-21 DIAGNOSIS — I10 Essential (primary) hypertension: Secondary | ICD-10-CM | POA: Diagnosis not present

## 2020-03-21 DIAGNOSIS — J329 Chronic sinusitis, unspecified: Secondary | ICD-10-CM | POA: Diagnosis not present

## 2020-03-21 LAB — BASIC METABOLIC PANEL
BUN/Creatinine Ratio: 18 (ref 9–23)
BUN: 16 mg/dL (ref 6–24)
CO2: 26 mmol/L (ref 20–29)
Calcium: 10.3 mg/dL — ABNORMAL HIGH (ref 8.7–10.2)
Chloride: 99 mmol/L (ref 96–106)
Creatinine, Ser: 0.87 mg/dL (ref 0.57–1.00)
GFR calc Af Amer: 85 mL/min/{1.73_m2} (ref >59)
GFR calc non Af Amer: 74 mL/min/{1.73_m2} (ref >59)
Glucose: 97 mg/dL (ref 65–99)
Potassium: 4.2 mmol/L (ref 3.5–5.2)
Sodium: 139 mmol/L (ref 134–144)

## 2020-03-21 MED ORDER — LISINOPRIL 20 MG PO TABS: 20.0000 mg | ORAL_TABLET | Freq: Every day | ORAL | 3 refills | Status: DC

## 2020-03-21 MED ORDER — AMOXICILLIN 875 MG PO TABS
875.0000 mg | ORAL_TABLET | Freq: Two times a day (BID) | ORAL | 0 refills | Status: DC
Start: 2020-03-21 — End: 2020-08-03

## 2020-03-21 NOTE — Progress Notes (Signed)
Established Patient Office Visit  Subjective:  Patient ID: Caroline Kelly, female    DOB: [birth year]  Age: 59 y.o. MRN: [id number]  CC:  Chief Complaint  Patient presents with  . Hypertension  . Hyperlipidemia    HPI MERA GUNKEL presents for follow on tele-health visit -chills/headache/nasal-pt took zpack and thought symptoms improved but did not resolve. Pt is getting ready to fly for vacation and is worried about  Congestion/fever/sinus pressure   HTN-lisinopril--HCTZ-BID -pt feels to much medication at night with twice a day dosing-no headaches, no elevated bp at home  Hypothyroid-levothyroxine-TSH-4/21  Joint pain improved after cholesterol medication discontinued-pt cancelled rheumatology Past Medical History:  Diagnosis Date  . Allergy   . GERD (gastroesophageal reflux disease)   . Hyperlipidemia   . Hypertension     Past Surgical History:  Procedure Laterality Date  . CESAREAN SECTION    . ENDOMETRIAL ABLATION  1989    Family History  Problem Relation Age of Onset  . Heart failure Mother   . Dementia Mother   . Hyperlipidemia Father   . Hypertension Father   . CAD Father   . Hypertension Sister   . Hypertension Brother     Social History   Socioeconomic History  . Marital status: Married    Spouse name: Not on file  . Number of children: Not on file  . Years of education: Not on file  . Highest education level: Not on file  Occupational History  . Not on file  Tobacco Use  . Smoking status: Former Smoker    Quit date: 09/2017    Years since quitting: 2.5  . Smokeless tobacco: Never Used  Substance and Sexual Activity  . Alcohol use: Yes    Comment: twice monthly  . Drug use: Not on file  . Sexual activity: Not on file  Other Topics Concern  . Not on file  Social History Narrative  . Not on file   Social Determinants of Health   Financial Resource Strain:   . Difficulty of Paying Living Expenses:   Food Insecurity:   . Worried  About Programme researcher, broadcasting/film/video in the Last Year:   . Barista in the Last Year:   Transportation Needs:   . Freight forwarder (Medical):   Marland Kitchen Lack of Transportation (Non-Medical):   Physical Activity:   . Days of Exercise per Week:   . Minutes of Exercise per Session:   Stress:   . Feeling of Stress :   Social Connections:   . Frequency of Communication with Friends and Family:   . Frequency of Social Gatherings with Friends and Family:   . Attends Religious Services:   . Active Member of Clubs or Organizations:   . Attends Banker Meetings:   Marland Kitchen Marital Status:   Intimate Partner Violence:   . Fear of Current or Ex-Partner:   . Emotionally Abused:   Marland Kitchen Physically Abused:   . Sexually Abused:     Outpatient Medications Prior to Visit  Medication Sig Dispense Refill  . levothyroxine (SYNTHROID) 25 MCG tablet TAKE ONE TABLET BY MOUTH ONCE DAILY. 90 tablet 3  . lisinopril-hydrochlorothiazide (ZESTORETIC) 20-12.5 MG tablet TAKE ONE TABLET BY MOUTH TWICE DAILY 180 tablet 1  . loratadine (CLARITIN) 10 MG tablet Take 10 mg by mouth daily.    Marland Kitchen amLODipine (NORVASC) 5 MG tablet Take 1 tablet (5 mg total) by mouth daily. 90 tablet 3  . azithromycin (ZITHROMAX) 250  MG tablet 2 po day one then 1 po days 2-5 6 tablet 0  . fluticasone (FLONASE) 50 MCG/ACT nasal spray Place 1 spray into both nostrils 2 (two) times daily. 8 g 5  . triamcinolone cream (KENALOG) 0.1 % Apply 1 application topically 2 (two) times daily. 30 g 0   No facility-administered medications prior to visit.    Allergies  Allergen Reactions  . Levofloxacin Hives    ROS Review of Systems  Constitutional: Positive for fatigue.  HENT: Positive for congestion, sinus pressure and sinus pain. Negative for ear pain.   Respiratory: Negative.   Cardiovascular: Negative.   Gastrointestinal: Negative.   Musculoskeletal: Positive for arthralgias and myalgias.      Objective:    Physical  Exam Constitutional:      Appearance: Normal appearance.  HENT:     Head: Normocephalic and atraumatic.     Right Ear: Tympanic membrane and ear canal normal.     Left Ear: Tympanic membrane and ear canal normal.     Nose: Congestion present.     Mouth/Throat:     Pharynx: No oropharyngeal exudate or posterior oropharyngeal erythema.  Eyes:     Conjunctiva/sclera: Conjunctivae normal.  Cardiovascular:     Rate and Rhythm: Normal rate and regular rhythm.     Pulses: Normal pulses.     Heart sounds: Normal heart sounds.  Pulmonary:     Effort: Pulmonary effort is normal.     Breath sounds: Normal breath sounds.  Musculoskeletal:        General: No swelling or tenderness.     Cervical back: Normal range of motion and neck supple.  Neurological:     Mental Status: She is alert and oriented to person, place, and time.  Psychiatric:        Mood and Affect: Mood normal.        Behavior: Behavior normal.     BP 116/70   Pulse 68   Temp (!) 96.8 F (36 C)   Resp 16   Ht 5\' 3"  (1.6 m)   Wt 182 lb 6.4 oz (82.7 kg)   BMI 32.31 kg/m  Wt Readings from Last 3 Encounters:  03/21/20 182 lb 6.4 oz (82.7 kg)  02/26/20 183 lb (83 kg)  02/04/20 193 lb (87.5 kg)     Health Maintenance Due  Topic Date Due  . Hepatitis C Screening  Never done  . COVID-19 Vaccine (1) Never done  . HIV Screening  Never done  . TETANUS/TDAP  Never done  . PAP SMEAR-Modifier  Never done  . MAMMOGRAM  Never done  . COLONOSCOPY  Never done    Lab Results  Component Value Date   TSH 2.130 12/18/2019   Lab Results  Component Value Date   WBC 4.6 12/18/2019   HGB 13.8 12/18/2019   HCT 41.4 12/18/2019   MCV 89 12/18/2019   PLT 232 12/18/2019   Lab Results  Component Value Date   NA 140 12/18/2019   K 4.3 12/18/2019   CO2 25 12/18/2019   GLUCOSE 90 12/18/2019   BUN 15 12/18/2019   CREATININE 0.81 12/18/2019   BILITOT 0.4 12/18/2019   ALKPHOS 65 12/18/2019   AST 34 12/18/2019   ALT 31  12/18/2019   PROT 6.8 12/18/2019   ALBUMIN 4.5 12/18/2019   CALCIUM 9.8 12/18/2019   Lab Results  Component Value Date   CHOL 217 (H) 12/18/2019   Lab Results  Component Value Date   HDL 49  12/18/2019   Lab Results  Component Value Date   LDLCALC 141 (H) 12/18/2019   Lab Results  Component Value Date   TRIG 153 (H) 12/18/2019   Lab Results  Component Value Date   CHOLHDL 4.4 12/18/2019     Assessment & Plan:  1. Essential hypertension, benign Change bp meds to taking combo lisinopril/HCTZ in the morning and lisinopril-rx ONLY at night. Take bp readings at home-f/u in [facility] for re-evaluation - Basic metabolic panel  2. Sinusitis, unspecified chronicity, unspecified location Amoxil-rx, mucinex-otc, flonase Follow-up: HTN   Royale Swamy Mat Carne, MD

## 2020-03-21 NOTE — Patient Instructions (Addendum)
Take the lisinopril/HCTZ 20/12.5 in the morning and take ONLY lisinopril in the evening NO HCTZ . Continue to take blood pressure reading in the morning and in the evening or anytime your symptoms are concerning.    Use flonase -2 sprays to each nostril Mucinex 600mg   12 hour-take twice a day Stop Claritin Stop Sudafed Managing Your Hypertension Hypertension is commonly called high blood pressure. This is when the force of your blood pressing against the walls of your arteries is too strong. Arteries are blood vessels that carry blood from your heart throughout your body. Hypertension forces the heart to work harder to pump blood, and may cause the arteries to become narrow or stiff. Having untreated or uncontrolled hypertension can cause heart attack, stroke, kidney disease, and other problems. What are blood pressure readings? A blood pressure reading consists of a higher number over a lower number. Ideally, your blood pressure should be below 120/80. The first ("top") number is called the systolic pressure. It is a measure of the pressure in your arteries as your heart beats. The second ("bottom") number is called the diastolic pressure. It is a measure of the pressure in your arteries as the heart relaxes. What does my blood pressure reading mean? Blood pressure is classified into four stages. Based on your blood pressure reading, your health care provider may use the following stages to determine what type of treatment you need, if any. Systolic pressure and diastolic pressure are measured in a unit called mm Hg. Normal  Systolic pressure: below 120.  Diastolic pressure: below 80. Elevated  Systolic pressure: 120-129.  Diastolic pressure: below 80. Hypertension stage 1  Systolic pressure: 130-139.  Diastolic pressure: 80-89. Hypertension stage 2  Systolic pressure: 140 or above.  Diastolic pressure: 90 or above. What health risks are associated with hypertension? Managing your  hypertension is an important responsibility. Uncontrolled hypertension can lead to:  A heart attack.  A stroke.  A weakened blood vessel (aneurysm).  Heart failure.  Kidney damage.  Eye damage.  Metabolic syndrome.  Memory and concentration problems. What changes can I make to manage my hypertension? Hypertension can be managed by making lifestyle changes and possibly by taking medicines. Your health care provider will help you make a plan to bring your blood pressure within a normal range. Eating and drinking   Eat a diet that is high in fiber and potassium, and low in salt (sodium), added sugar, and fat. An example eating plan is called the DASH (Dietary Approaches to Stop Hypertension) diet. To eat this way: ? Eat plenty of fresh fruits and vegetables. Try to fill half of your plate at each meal with fruits and vegetables. ? Eat whole grains, such as whole wheat pasta, brown rice, or whole grain bread. Fill about one quarter of your plate with whole grains. ? Eat low-fat diary products. ? Avoid fatty cuts of meat, processed or cured meats, and poultry with skin. Fill about one quarter of your plate with lean proteins such as fish, chicken without skin, beans, eggs, and tofu. ? Avoid premade and processed foods. These tend to be higher in sodium, added sugar, and fat.  Reduce your daily sodium intake. Most people with hypertension should eat less than 1,500 mg of sodium a day.  Limit alcohol intake to no more than 1 drink a day for nonpregnant women and 2 drinks a day for men. One drink equals 12 oz of beer, 5 oz of wine, or 1 oz of hard liquor.  Lifestyle  Work with your health care provider to maintain a healthy body weight, or to lose weight. Ask what an ideal weight is for you.  Get at least 30 minutes of exercise that causes your heart to beat faster (aerobic exercise) most days of the week. Activities may include walking, swimming, or biking.  Include exercise to  strengthen your muscles (resistance exercise), such as weight lifting, as part of your weekly exercise routine. Try to do these types of exercises for 30 minutes at least 3 days a week.  Do not use any products that contain nicotine or tobacco, such as cigarettes and e-cigarettes. If you need help quitting, ask your health care provider.  Control any long-term (chronic) conditions you have, such as high cholesterol or diabetes. Monitoring  Monitor your blood pressure at home as told by your health care provider. Your personal target blood pressure may vary depending on your medical conditions, your age, and other factors.  Have your blood pressure checked regularly, as often as told by your health care provider. Working with your health care provider  Review all the medicines you take with your health care provider because there may be side effects or interactions.  Talk with your health care provider about your diet, exercise habits, and other lifestyle factors that may be contributing to hypertension.  Visit your health care provider regularly. Your health care provider can help you create and adjust your plan for managing hypertension. Will I need medicine to control my blood pressure? Your health care provider may prescribe medicine if lifestyle changes are not enough to get your blood pressure under control, and if:  Your systolic blood pressure is 130 or higher.  Your diastolic blood pressure is 80 or higher. Take medicines only as told by your health care provider. Follow the directions carefully. Blood pressure medicines must be taken as prescribed. The medicine does not work as well when you skip doses. Skipping doses also puts you at risk for problems. Contact a health care provider if:  You think you are having a reaction to medicines you have taken.  You have repeated (recurrent) headaches.  You feel dizzy.  You have swelling in your ankles.  You have trouble with your  vision. Get help right away if:  You develop a severe headache or confusion.  You have unusual weakness or numbness, or you feel faint.  You have severe pain in your chest or abdomen.  You vomit repeatedly.  You have trouble breathing. Summary  Hypertension is when the force of blood pumping through your arteries is too strong. If this condition is not controlled, it may put you at risk for serious complications.  Your personal target blood pressure may vary depending on your medical conditions, your age, and other factors. For most people, a normal blood pressure is less than 120/80.  Hypertension is managed by lifestyle changes, medicines, or both. Lifestyle changes include weight loss, eating a healthy, low-sodium diet, exercising more, and limiting alcohol. This information is not intended to replace advice given to you by your health care provider. Make sure you discuss any questions you have with your health care provider. Document Revised: 12/12/2018 Document Reviewed: 07/18/2016 Elsevier Patient Education  2020 [employer].

## 2020-06-27 ENCOUNTER — Other Ambulatory Visit: Payer: Self-pay | Admitting: Family Medicine

## 2020-06-27 DIAGNOSIS — I10 Essential (primary) hypertension: Secondary | ICD-10-CM

## 2020-06-27 NOTE — Telephone Encounter (Signed)
Needs fasting appointment.

## 2020-07-20 ENCOUNTER — Telehealth: Payer: Self-pay

## 2020-07-20 NOTE — Telephone Encounter (Signed)
Informed patient that [facility] sent documentation stating they will no longer cover fluticasone spray. Patient informed she can try otc medications.

## 2020-07-25 ENCOUNTER — Other Ambulatory Visit: Payer: Self-pay

## 2020-07-25 MED ORDER — LISINOPRIL 20 MG PO TABS: 20.0000 mg | ORAL_TABLET | Freq: Every day | ORAL | 3 refills | Status: DC

## 2020-08-03 ENCOUNTER — Telehealth (INDEPENDENT_AMBULATORY_CARE_PROVIDER_SITE_OTHER): Payer: BC Managed Care – PPO | Admitting: Nurse Practitioner

## 2020-08-03 ENCOUNTER — Encounter: Payer: Self-pay | Admitting: Nurse Practitioner

## 2020-08-03 VITALS — BP 130/81 | Ht 63.0 in | Wt 182.0 lb

## 2020-08-03 DIAGNOSIS — Z8701 Personal history of pneumonia (recurrent): Secondary | ICD-10-CM

## 2020-08-03 DIAGNOSIS — E782 Mixed hyperlipidemia: Secondary | ICD-10-CM

## 2020-08-03 DIAGNOSIS — I10 Essential (primary) hypertension: Secondary | ICD-10-CM

## 2020-08-03 DIAGNOSIS — J0100 Acute maxillary sinusitis, unspecified: Secondary | ICD-10-CM | POA: Diagnosis not present

## 2020-08-03 DIAGNOSIS — J309 Allergic rhinitis, unspecified: Secondary | ICD-10-CM

## 2020-08-03 DIAGNOSIS — E785 Hyperlipidemia, unspecified: Secondary | ICD-10-CM

## 2020-08-03 DIAGNOSIS — Z87891 Personal history of nicotine dependence: Secondary | ICD-10-CM | POA: Diagnosis not present

## 2020-08-03 DIAGNOSIS — Z8616 Personal history of COVID-19: Secondary | ICD-10-CM

## 2020-08-03 DIAGNOSIS — Z6832 Body mass index (BMI) 32.0-32.9, adult: Secondary | ICD-10-CM

## 2020-08-03 DIAGNOSIS — E039 Hypothyroidism, unspecified: Secondary | ICD-10-CM

## 2020-08-03 MED ORDER — FLUTICASONE PROPIONATE 50 MCG/ACT NA SUSP: 2.0000 | Freq: Every day | NASAL | 6 refills | Status: DC

## 2020-08-03 MED ORDER — CEFDINIR 300 MG PO CAPS: 300.0000 mg | ORAL_CAPSULE | Freq: Two times a day (BID) | ORAL | 0 refills | Status: DC

## 2020-08-03 MED ORDER — LORATADINE 10 MG PO TABS: 10.0000 mg | ORAL_TABLET | Freq: Every day | ORAL | 1 refills | Status: DC

## 2020-08-03 NOTE — Progress Notes (Addendum)
Virtual Visit via Telephone Note   This visit type was conducted due to national recommendations for restrictions regarding the COVID-19 Pandemic (e.g. social distancing) in an effort to limit this patient's exposure and mitigate transmission in our community.  Due to her co-morbid illnesses, this patient is at least at moderate risk for complications without adequate follow up.  This format is felt to be most appropriate for this patient at this time.  The patient did not have access to video technology/had technical difficulties with video requiring transitioning to audio format only (telephone).  All issues noted in this document were discussed and addressed.  No physical exam could be performed with this format.  Patient verbally consented to a telehealth visit.   Date:  08/03/2020   ID:  [name], DOB 11/08/1960, MRN [id number]  Patient Location: Home Provider Location: Office  PCP:  Blane Ohara, MD   Evaluation Performed:  Established patient, acute visit  Chief Complaint:  Sinus pain and dyspnea  History of Present Illness:    Caroline Kelly is a 59 y.o. female with sinus pain/pressure, nasal congestion, cough, sore throat, and dyspnea with activity. The onset of symptoms began 4-days and began with profuse diarrhea and nausea after a [employer] on Sunday. She states she has experienced aches and chills but denies fever. Treatment has been Mucinex.  Pt has a history of COVID-19 in January 2021. She tells me she had recurrent pneumonia three times after an upper respiratory infection prior to having COVID-19. She is a former cigarette smoker, quit 3 years-ago. She states she is obese and is physically deconditioned. BMI 32.24 per last in-office visit. She has a history of allergic rhinitis that she is currently not treating daily. Other past medical history includes hypertension, hyperlipidemia, and hypothyroidism. She has received 2 COVID-19 vaccines and booster. Her flu  immunization is current.  The patient does have symptoms concerning for COVID-19 infection (fever, chills, cough, or new shortness of breath).    Past Medical History:  Diagnosis Date  . Allergy   . GERD (gastroesophageal reflux disease)   . Hyperlipidemia   . Hypertension     Past Surgical History:  Procedure Laterality Date  . CESAREAN SECTION    . ENDOMETRIAL ABLATION  1989    Family History  Problem Relation Age of Onset  . Heart failure Mother   . Dementia Mother   . Hyperlipidemia Father   . Hypertension Father   . CAD Father   . Hypertension Sister   . Hypertension Brother     Social History   Socioeconomic History  . Marital status: Married    Spouse name: Not on file  . Number of children: Not on file  . Years of education: Not on file  . Highest education level: Not on file  Occupational History  . Not on file  Tobacco Use  . Smoking status: Former Smoker    Quit date: 09/2017    Years since quitting: 2.9  . Smokeless tobacco: Never Used  Substance and Sexual Activity  . Alcohol use: Yes    Comment: twice monthly  . Drug use: Not on file  . Sexual activity: Not on file  Other Topics Concern  . Not on file  Social History Narrative  . Not on file   Social Determinants of Health   Financial Resource Strain:   . Difficulty of Paying Living Expenses: Not on file  Food Insecurity:   . Worried About [employer] of  Food in the Last Year: Not on file  . Ran Out of Food in the Last Year: Not on file  Transportation Needs:   . Lack of Transportation (Medical): Not on file  . Lack of Transportation (Non-Medical): Not on file  Physical Activity:   . Days of Exercise per Week: Not on file  . Minutes of Exercise per Session: Not on file  Stress:   . Feeling of Stress : Not on file  Social Connections:   . Frequency of Communication with Friends and Family: Not on file  . Frequency of Social Gatherings with Friends and Family: Not on file  . Attends  Religious Services: Not on file  . Active Member of Clubs or Organizations: Not on file  . Attends Banker Meetings: Not on file  . Marital Status: Not on file  Intimate Partner Violence:   . Fear of Current or Ex-Partner: Not on file  . Emotionally Abused: Not on file  . Physically Abused: Not on file  . Sexually Abused: Not on file    Outpatient Medications Prior to Visit  Medication Sig Dispense Refill  . Ascorbic Acid (VITAMIN C PO) Take by mouth.    . levothyroxine (SYNTHROID) 25 MCG tablet TAKE ONE TABLET BY MOUTH ONCE DAILY. 90 tablet 3  . lisinopril (ZESTRIL) 20 MG tablet Take 1 tablet (20 mg total) by mouth daily. 30 tablet 3  . lisinopril-hydrochlorothiazide (ZESTORETIC) 20-12.5 MG tablet TAKE ONE TABLET BY MOUTH TWICE DAILY. 180 tablet 0  . VITAMIN D PO Take by mouth.    Marland Kitchen amoxicillin (AMOXIL) 875 MG tablet Take 1 tablet (875 mg total) by mouth 2 (two) times daily. 20 tablet 0  . loratadine (CLARITIN) 10 MG tablet Take 10 mg by mouth daily.     No facility-administered medications prior to visit.    Allergies:   Levofloxacin   Social History   Tobacco Use  . Smoking status: Former Smoker    Quit date: 09/2017    Years since quitting: 2.9  . Smokeless tobacco: Never Used  Substance Use Topics  . Alcohol use: Yes    Comment: twice monthly  . Drug use: Not on file     Review of Systems  Constitutional: Positive for chills and malaise/fatigue. Negative for fever.  HENT: Positive for congestion, sinus pain (sinus pressure and tenderness) and sore throat. Negative for ear pain.   Respiratory: Positive for cough and shortness of breath. Negative for wheezing.   Cardiovascular: Negative for chest pain.  Gastrointestinal: Positive for diarrhea and nausea. Negative for abdominal pain, constipation and vomiting.  Genitourinary: Negative for frequency and urgency.  Musculoskeletal: Positive for joint pain (chronic to bilateral hands and feet) and myalgias  (chronic to bilateral hands and feet).  Neurological: Positive for headaches. Negative for dizziness.     Labs/Other Tests and Data Reviewed:    Recent Labs: 12/18/2019: ALT 31; Hemoglobin 13.8; Platelets 232; TSH 2.130 03/21/2020: BUN 16; Creatinine, Ser 0.87; Potassium 4.2; Sodium 139   Recent Lipid Panel Lab Results  Component Value Date/Time   CHOL 217 (H) 12/18/2019 11:25 AM   TRIG 153 (H) 12/18/2019 11:25 AM   HDL 49 12/18/2019 11:25 AM   CHOLHDL 4.4 12/18/2019 11:25 AM   LDLCALC 141 (H) 12/18/2019 11:25 AM    Wt Readings from Last 3 Encounters:  03/21/20 182 lb 6.4 oz (82.7 kg)  02/26/20 183 lb (83 kg)  02/04/20 193 lb (87.5 kg)     Objective:  Vital Signs:  BP 130/81   Ht 5\' 3"  (1.6 m)   Wt 182 lb (82.6 kg)   BMI 32.24 kg/m    Physical Exam No physical exam performed due to telemed visit  ASSESSMENT & PLAN:    1. Acute non-recurrent maxillary sinusitis - loratadine (CLARITIN) 10 MG tablet; Take 1 tablet (10 mg total) by mouth daily.  Dispense: 90 tablet; Refill: 1 - fluticasone (FLONASE) 50 MCG/ACT nasal spray; Place 2 sprays into both nostrils daily.  Dispense: 16 g; Refill: 6  2. Former cigarette smoker of unknown amount  3. History of pneumonia  4. Personal history of COVID-19  5. Allergic rhinitis, unspecified seasonality, unspecified trigger - loratadine (CLARITIN) 10 MG tablet; Take 1 tablet (10 mg total) by mouth daily.  Dispense: 90 tablet; Refill: 1 - fluticasone (FLONASE) 50 MCG/ACT nasal spray; Place 2 sprays into both nostrils daily.  Dispense: 16 g; Refill: 6  6. BMI 32.0-32.9,adult  COVID-19 Education: The signs and symptoms of COVID-19 were discussed with the patient and how to seek care for testing (follow up with PCP or arrange E-visit). The importance of social distancing was discussed today.   I spent 16 minutes dedicated to the care of this patient on the date of this telephone encounter that includes EMR review and prescription  medication management.  Telephone call: [phone]  Follow Up:  Virtual Visit  prn  Signed,  [phone], DNP  08/03/2020 2:56    [facility]

## 2020-08-10 ENCOUNTER — Other Ambulatory Visit: Payer: Self-pay

## 2020-08-10 ENCOUNTER — Encounter: Payer: Self-pay | Admitting: Family Medicine

## 2020-08-10 ENCOUNTER — Ambulatory Visit: Payer: BC Managed Care – PPO | Admitting: Family Medicine

## 2020-08-10 VITALS — BP 142/70 | HR 72 | Temp 96.8°F | Resp 18 | Ht 63.0 in | Wt 192.0 lb

## 2020-08-10 DIAGNOSIS — Z1231 Encounter for screening mammogram for malignant neoplasm of breast: Secondary | ICD-10-CM

## 2020-08-10 DIAGNOSIS — J0181 Other acute recurrent sinusitis: Secondary | ICD-10-CM

## 2020-08-10 DIAGNOSIS — I10 Essential (primary) hypertension: Secondary | ICD-10-CM

## 2020-08-10 DIAGNOSIS — E7849 Other hyperlipidemia: Secondary | ICD-10-CM | POA: Diagnosis not present

## 2020-08-10 DIAGNOSIS — E782 Mixed hyperlipidemia: Secondary | ICD-10-CM

## 2020-08-10 DIAGNOSIS — E6609 Other obesity due to excess calories: Secondary | ICD-10-CM

## 2020-08-10 DIAGNOSIS — Z23 Encounter for immunization: Secondary | ICD-10-CM | POA: Diagnosis not present

## 2020-08-10 DIAGNOSIS — Z6834 Body mass index (BMI) 34.0-34.9, adult: Secondary | ICD-10-CM

## 2020-08-10 MED ORDER — CEFDINIR 300 MG PO CAPS: 300.0000 mg | ORAL_CAPSULE | Freq: Two times a day (BID) | ORAL | 0 refills | Status: DC

## 2020-08-10 MED ORDER — LISINOPRIL 40 MG PO TABS: 40.0000 mg | ORAL_TABLET | Freq: Two times a day (BID) | ORAL | 0 refills | Status: DC

## 2020-08-10 MED ORDER — VALSARTAN-HYDROCHLOROTHIAZIDE 80-12.5 MG PO TABS: 1.0000 | ORAL_TABLET | Freq: Every day | ORAL | 0 refills | Status: DC

## 2020-08-10 MED ORDER — CEFTRIAXONE SODIUM 1 G IJ SOLR
1.0000 g | Freq: Once | INTRAMUSCULAR | Status: AC
  Administered 2020-08-10: 1 g via INTRAMUSCULAR

## 2020-08-10 MED ORDER — HYDROCHLOROTHIAZIDE 25 MG PO TABS: 25.0000 mg | ORAL_TABLET | Freq: Every day | ORAL | 0 refills | Status: DC

## 2020-08-10 NOTE — Patient Instructions (Addendum)
Hypertension: stop lisinopril and lisinopril/hct.  Start on valsarta/hctz 80/12.5 mg once daily. Sinus infection:  Rocephin shot given  Cefdinir rx called for full 10 days.   Flu shot and shingrix vaccine given today.   DASH Eating Plan DASH stands for "Dietary Approaches to Stop Hypertension." The DASH eating plan is a healthy eating plan that has been shown to reduce high blood pressure (hypertension). It may also reduce your risk for type 2 diabetes, heart disease, and stroke. The DASH eating plan may also help with weight loss. What are tips for following this plan?  General guidelines  Avoid eating more than 2,300 mg (milligrams) of salt (sodium) a day. If you have hypertension, you may need to reduce your sodium intake to 1,500 mg a day.  Limit alcohol intake to no more than 1 drink a day for nonpregnant women and 2 drinks a day for men. One drink equals 12 oz of beer, 5 oz of wine, or 1 oz of hard liquor.  Work with your health care provider to maintain a healthy body weight or to lose weight. Ask what an ideal weight is for you.  Get at least 30 minutes of exercise that causes your heart to beat faster (aerobic exercise) most days of the week. Activities may include walking, swimming, or biking.  Work with your health care provider or diet and nutrition specialist (dietitian) to adjust your eating plan to your individual calorie needs. Reading food labels   Check food labels for the amount of sodium per serving. Choose foods with less than 5 percent of the Daily Value of sodium. Generally, foods with less than 300 mg of sodium per serving fit into this eating plan.  To find whole grains, look for the word "whole" as the first word in the ingredient list. Shopping  Buy products labeled as "low-sodium" or "no salt added."  Buy fresh foods. Avoid canned foods and premade or frozen meals. Cooking  Avoid adding salt when cooking. Use salt-free seasonings or herbs instead of  table salt or sea salt. Check with your health care provider or pharmacist before using salt substitutes.  Do not fry foods. Cook foods using healthy methods such as baking, boiling, grilling, and broiling instead.  Cook with heart-healthy oils, such as olive, canola, soybean, or sunflower oil. Meal planning  Eat a balanced diet that includes: ? 5 or more servings of fruits and vegetables each day. At each meal, try to fill half of your plate with fruits and vegetables. ? Up to 6-8 servings of whole grains each day. ? Less than 6 oz of lean meat, poultry, or fish each day. A 3-oz serving of meat is about the same size as a deck of cards. One egg equals 1 oz. ? 2 servings of low-fat dairy each day. ? A serving of nuts, seeds, or beans 5 times each week. ? Heart-healthy fats. Healthy fats called Omega-3 fatty acids are found in foods such as flaxseeds and coldwater fish, like sardines, salmon, and mackerel.  Limit how much you eat of the following: ? Canned or prepackaged foods. ? Food that is high in trans fat, such as fried foods. ? Food that is high in saturated fat, such as fatty meat. ? Sweets, desserts, sugary drinks, and other foods with added sugar. ? Full-fat dairy products.  Do not salt foods before eating.  Try to eat at least 2 vegetarian meals each week.  Eat more home-cooked food and less restaurant, buffet, and fast food.  When eating at a restaurant, ask that your food be prepared with less salt or no salt, if possible. What foods are recommended? The items listed may not be a complete list. Talk with your dietitian about what dietary choices are best for you. Grains Whole-grain or whole-wheat bread. Whole-grain or whole-wheat pasta. Brown rice. [place]. Bulgur. Whole-grain and low-sodium cereals. Pita bread. Low-fat, low-sodium crackers. Whole-wheat flour tortillas. Vegetables Fresh or frozen vegetables (raw, steamed, roasted, or grilled). Low-sodium or  reduced-sodium tomato and vegetable juice. Low-sodium or reduced-sodium tomato sauce and tomato paste. Low-sodium or reduced-sodium canned vegetables. Fruits All fresh, dried, or frozen fruit. Canned fruit in natural juice (without added sugar). Meat and other protein foods Skinless chicken or [place]. Ground chicken or [place]. Pork with fat trimmed off. Fish and seafood. Egg whites. Dried beans, peas, or lentils. Unsalted nuts, nut butters, and seeds. Unsalted canned beans. Lean cuts of beef with fat trimmed off. Low-sodium, lean deli meat. Dairy Low-fat (1%) or fat-free (skim) milk. Fat-free, low-fat, or reduced-fat cheeses. Nonfat, low-sodium ricotta or cottage cheese. Low-fat or nonfat yogurt. Low-fat, low-sodium cheese. Fats and oils Soft margarine without trans fats. Vegetable oil. Low-fat, reduced-fat, or light mayonnaise and salad dressings (reduced-sodium). Canola, safflower, olive, soybean, and sunflower oils. Avocado. Seasoning and other foods Herbs. Spices. Seasoning mixes without salt. Unsalted popcorn and pretzels. Fat-free sweets. What foods are not recommended? The items listed may not be a complete list. Talk with your dietitian about what dietary choices are best for you. Grains Baked goods made with fat, such as croissants, muffins, or some breads. Dry pasta or rice meal packs. Vegetables Creamed or fried vegetables. Vegetables in a cheese sauce. Regular canned vegetables (not low-sodium or reduced-sodium). Regular canned tomato sauce and paste (not low-sodium or reduced-sodium). Regular tomato and vegetable juice (not low-sodium or reduced-sodium). Angie Fava. Olives. Fruits Canned fruit in a light or heavy syrup. Fried fruit. Fruit in cream or butter sauce. Meat and other protein foods Fatty cuts of meat. Ribs. Fried meat. Berniece Salines. Sausage. Bologna and other processed lunch meats. Salami. Fatback. Hotdogs. Bratwurst. Salted nuts and seeds. Canned beans with added salt. Canned or  smoked fish. Whole eggs or egg yolks. Chicken or [place] with skin. Dairy Whole or 2% milk, cream, and half-and-half. Whole or full-fat cream cheese. Whole-fat or sweetened yogurt. Full-fat cheese. Nondairy creamers. Whipped toppings. Processed cheese and cheese spreads. Fats and oils Butter. Stick margarine. Lard. Shortening. Ghee. Bacon fat. Tropical oils, such as coconut, palm kernel, or palm oil. Seasoning and other foods Salted popcorn and pretzels. Onion salt, garlic salt, seasoned salt, table salt, and sea salt. Worcestershire sauce. Tartar sauce. Barbecue sauce. Teriyaki sauce. Soy sauce, including reduced-sodium. Steak sauce. Canned and packaged gravies. Fish sauce. Oyster sauce. Cocktail sauce. Horseradish that you find on the shelf. Ketchup. Mustard. Meat flavorings and tenderizers. Bouillon cubes. Hot sauce and Tabasco sauce. Premade or packaged marinades. Premade or packaged taco seasonings. Relishes. Regular salad dressings. Where to find more information:  National Heart, Lung, and [place]: https://wilson-eaton.com/  American Heart Association: www.heart.org Summary  The DASH eating plan is a healthy eating plan that has been shown to reduce high blood pressure (hypertension). It may also reduce your risk for type 2 diabetes, heart disease, and stroke.  With the DASH eating plan, you should limit salt (sodium) intake to 2,300 mg a day. If you have hypertension, you may need to reduce your sodium intake to 1,500 mg a day.  When on the DASH eating plan,  aim to eat more fresh fruits and vegetables, whole grains, lean proteins, low-fat dairy, and heart-healthy fats.  Work with your health care provider or diet and nutrition specialist (dietitian) to adjust your eating plan to your individual calorie needs. This information is not intended to replace advice given to you by your health care provider. Make sure you discuss any questions you have with your health care provider. Document  Revised: 08/02/2017 Document Reviewed: 08/13/2016 Elsevier Patient Education  2020 [employer].

## 2020-08-10 NOTE — Progress Notes (Addendum)
Subjective:  Patient ID: [name], female    DOB: 28-May-1961  Age: 59 y.o. MRN: [id number]  Chief Complaint  Patient presents with  . Hyperlipidemia  . Hypertension    HPI Sinusitis: took cefdinir twice a day for 5 days. Nasal congestion. Improved, but not resolved.  Mucinex helps.   hypertension: Patient eating healthy not exercising.  Taking Zestril 20 mg once a day and Zestoretic 20/12.5 once a day.  Hyperlipidemia: Patient was put on Crestor but did not tolerate this.  Caused muscle pain.  She does try to eat a low-fat diet.  Current Outpatient Medications on File Prior to Visit  Medication Sig Dispense Refill  . Ascorbic Acid (VITAMIN C PO) Take by mouth.    . fluticasone (FLONASE) 50 MCG/ACT nasal spray Place 2 sprays into both nostrils daily. 16 g 6  . levothyroxine (SYNTHROID) 25 MCG tablet TAKE ONE TABLET BY MOUTH ONCE DAILY. 90 tablet 3  . loratadine (CLARITIN) 10 MG tablet Take 1 tablet (10 mg total) by mouth daily. 90 tablet 1  . Probiotic Product (PROBIOTIC-10 PO) Take by mouth.    Marland Kitchen VITAMIN D PO Take by mouth.     No current facility-administered medications on file prior to visit.   Past Medical History:  Diagnosis Date  . Allergy   . GERD (gastroesophageal reflux disease)   . Hyperlipidemia   . Hypertension    Past Surgical History:  Procedure Laterality Date  . CESAREAN SECTION    . ENDOMETRIAL ABLATION  1989    Family History  Problem Relation Age of Onset  . Heart failure Mother   . Dementia Mother   . Hyperlipidemia Father   . Hypertension Father   . CAD Father   . Hypertension Sister   . Hypertension Brother    Social History   Socioeconomic History  . Marital status: Married    Spouse name: Not on file  . Number of children: Not on file  . Years of education: Not on file  . Highest education level: Not on file  Occupational History  . Not on file  Tobacco Use  . Smoking status: Former Smoker    Types: Cigarettes    Quit  date: 09/2017    Years since quitting: 2.9  . Smokeless tobacco: Never Used  Substance and Sexual Activity  . Alcohol use: Yes    Comment: twice monthly  . Drug use: Never  . Sexual activity: Not on file  Other Topics Concern  . Not on file  Social History Narrative  . Not on file   Social Determinants of Health   Financial Resource Strain:   . Difficulty of Paying Living Expenses: Not on file  Food Insecurity:   . Worried About Programme researcher, broadcasting/film/video in the Last Year: Not on file  . Ran Out of Food in the Last Year: Not on file  Transportation Needs:   . Lack of Transportation (Medical): Not on file  . Lack of Transportation (Non-Medical): Not on file  Physical Activity:   . Days of Exercise per Week: Not on file  . Minutes of Exercise per Session: Not on file  Stress:   . Feeling of Stress : Not on file  Social Connections:   . Frequency of Communication with Friends and Family: Not on file  . Frequency of Social Gatherings with Friends and Family: Not on file  . Attends Religious Services: Not on file  . Active Member of Clubs or Organizations: Not  on file  . Attends Banker Meetings: Not on file  . Marital Status: Not on file    Review of Systems  Constitutional: Negative for chills, fatigue and fever.  HENT: Positive for sinus pressure. Negative for congestion, rhinorrhea and sore throat.   Respiratory: Positive for cough. Negative for shortness of breath.   Cardiovascular: Negative for chest pain and palpitations.  Gastrointestinal: Negative for abdominal pain, constipation, diarrhea, nausea and vomiting.  Genitourinary: Negative for dysuria and urgency.  Musculoskeletal: Positive for myalgias. Negative for back pain.  Neurological: Negative for dizziness, weakness, light-headedness and headaches.  Psychiatric/Behavioral: Negative for dysphoric mood. The patient is not nervous/anxious.      Objective:  BP (!) 142/70   Pulse 72   Temp (!) 96.8 F  (36 C)   Resp 18   Ht 5\' 3"  (1.6 m)   Wt 192 lb (87.1 kg)   BMI 34.01 kg/m   BP/Weight 08/10/2020 08/03/2020 03/21/2020  Systolic BP 142 130 116  Diastolic BP 70 81 70  Wt. (Lbs) 192 182 182.4  BMI 34.01 32.24 32.31    Physical Exam Vitals reviewed.  Constitutional:      Appearance: Normal appearance. She is normal weight.  HENT:     Right Ear: Tympanic membrane, ear canal and external ear normal.     Left Ear: Tympanic membrane, ear canal and external ear normal.     Nose: Congestion and rhinorrhea present.     Comments: Bilateral maxillary sinus tenderness    Mouth/Throat:     Pharynx: Oropharynx is clear.  Neck:     Vascular: No carotid bruit.  Cardiovascular:     Rate and Rhythm: Normal rate and regular rhythm.     Pulses: Normal pulses.     Heart sounds: Normal heart sounds. No murmur heard.   Pulmonary:     Effort: Pulmonary effort is normal. No respiratory distress.     Breath sounds: Normal breath sounds.  Abdominal:     General: Abdomen is flat. Bowel sounds are normal.     Palpations: Abdomen is soft.     Tenderness: There is no abdominal tenderness.  Neurological:     Mental Status: She is alert and oriented to person, place, and time.  Psychiatric:        Mood and Affect: Mood normal.        Behavior: Behavior normal.     Diabetic Foot Exam - Simple   No data filed       Lab Results  Component Value Date   WBC 4.6 12/18/2019   HGB 13.8 12/18/2019   HCT 41.4 12/18/2019   PLT 232 12/18/2019   GLUCOSE 97 03/21/2020   CHOL 217 (H) 12/18/2019   TRIG 153 (H) 12/18/2019   HDL 49 12/18/2019   LDLCALC 141 (H) 12/18/2019   ALT 31 12/18/2019   AST 34 12/18/2019   NA 139 03/21/2020   K 4.2 03/21/2020   CL 99 03/21/2020   CREATININE 0.87 03/21/2020   BUN 16 03/21/2020   CO2 26 03/21/2020   TSH 2.130 12/18/2019    Assessment/Plan: 1. Essential hypertension, benign stop lisinopril and lisinopril/hct.  Start on valsarta/hctz 80/12.5 mg once  daily. Check bp daily  Recommend continue to work on eating healthy diet and exercise. - CBC with Differential/Platelet - Lipid panel  3. Other acute recurrent sinusitis Rocephin shot given  Cefdinir rx called for full 10 days.  - cefTRIAXone (ROCEPHIN) injection 1 g  4. Need for shingles  vaccine - Varicella-zoster vaccine IM (Shingrix)  5. Encounter for screening mammogram for malignant neoplasm of breast - MM Digital Screening  6. Encounter for immunization - Flu Vaccine MDCK QUAD PF  7. Obesity with bmi 34 and serious comorbidities Recommend continue to work on eating healthy diet and exercise.  Meds ordered this encounter  Medications  . cefTRIAXone (ROCEPHIN) injection 1 g  . cefdinir (OMNICEF) 300 MG capsule    Sig: Take 1 capsule (300 mg total) by mouth 2 (two) times daily.    Dispense:  20 capsule    Refill:  0  . valsartan-hydrochlorothiazide (DIOVAN HCT) 80-12.5 MG tablet    Sig: Take 1 tablet by mouth daily.    Dispense:  90 tablet    Refill:  0    Orders Placed This Encounter  Procedures  . MM Digital Screening  . Varicella-zoster vaccine IM (Shingrix)  . Flu Vaccine MDCK QUAD PF  . CBC with Differential/Platelet  . Comprehensive metabolic panel  . Lipid panel  . TSH    I spent 30 minutes dedicated to the care of this patient on the date of this encounter to include face-to-face time with the patient, as well OA:[id number] on diet and exercise. DASH diet given. Discussion concerning statins.  Follow-up: Return in about 3 months (around 11/08/2020) for Fasting visit.  Will give second shingles vaccine at that time.Marland Kitchen  An After Visit Summary was printed and given to the patient.  Blane Ohara, MD [facility] [phone]

## 2020-08-11 LAB — CBC WITH DIFFERENTIAL/PLATELET
Basophils Absolute: 0.1 x10E3/uL (ref 0.0–0.2)
Basos: 1 %
EOS (ABSOLUTE): 0.2 x10E3/uL (ref 0.0–0.4)
Eos: 3 %
Hematocrit: 43.8 % (ref 34.0–46.6)
Hemoglobin: 14.7 g/dL (ref 11.1–15.9)
Immature Grans (Abs): 0.1 x10E3/uL (ref 0.0–0.1)
Immature Granulocytes: 1 %
Lymphocytes Absolute: 2.2 x10E3/uL (ref 0.7–3.1)
Lymphs: 42 %
MCH: 29.3 pg (ref 26.6–33.0)
MCHC: 33.6 g/dL (ref 31.5–35.7)
MCV: 87 fL (ref 79–97)
Monocytes Absolute: 0.5 x10E3/uL (ref 0.1–0.9)
Monocytes: 10 %
Neutrophils Absolute: 2.3 x10E3/uL (ref 1.4–7.0)
Neutrophils: 43 %
Platelets: 260 x10E3/uL (ref 150–450)
RBC: 5.01 x10E6/uL (ref 3.77–5.28)
RDW: 13.4 % (ref 11.7–15.4)
WBC: 5.3 x10E3/uL (ref 3.4–10.8)

## 2020-08-11 LAB — COMPREHENSIVE METABOLIC PANEL WITH GFR
ALT: 26 IU/L (ref 0–32)
AST: 30 IU/L (ref 0–40)
Albumin/Globulin Ratio: 1.8 (ref 1.2–2.2)
Albumin: 4.6 g/dL (ref 3.8–4.9)
Alkaline Phosphatase: 74 IU/L (ref 44–121)
BUN/Creatinine Ratio: 17 (ref 9–23)
BUN: 15 mg/dL (ref 6–24)
Bilirubin Total: 0.2 mg/dL (ref 0.0–1.2)
CO2: 25 mmol/L (ref 20–29)
Calcium: 10.1 mg/dL (ref 8.7–10.2)
Chloride: 102 mmol/L (ref 96–106)
Creatinine, Ser: 0.88 mg/dL (ref 0.57–1.00)
GFR calc Af Amer: 84 mL/min/1.73
GFR calc non Af Amer: 73 mL/min/1.73
Globulin, Total: 2.6 g/dL (ref 1.5–4.5)
Glucose: 90 mg/dL (ref 65–99)
Potassium: 4 mmol/L (ref 3.5–5.2)
Sodium: 144 mmol/L (ref 134–144)
Total Protein: 7.2 g/dL (ref 6.0–8.5)

## 2020-08-11 LAB — LIPID PANEL
Chol/HDL Ratio: 4.6 ratio — ABNORMAL HIGH (ref 0.0–4.4)
Cholesterol, Total: 300 mg/dL — ABNORMAL HIGH (ref 100–199)
HDL: 65 mg/dL (ref >39)
LDL Chol Calc (NIH): 221 mg/dL — ABNORMAL HIGH (ref 0–99)
Triglycerides: 84 mg/dL (ref 0–149)
VLDL Cholesterol Cal: 14 mg/dL (ref 5–40)

## 2020-08-11 LAB — CARDIOVASCULAR RISK ASSESSMENT

## 2020-08-11 LAB — TSH: TSH: 2.2 u[IU]/mL (ref 0.450–4.500)

## 2020-08-15 ENCOUNTER — Other Ambulatory Visit: Payer: Self-pay

## 2020-08-15 MED ORDER — ATORVASTATIN CALCIUM 40 MG PO TABS: 40.0000 mg | ORAL_TABLET | Freq: Every day | ORAL | 0 refills | Status: DC

## 2020-08-22 ENCOUNTER — Telehealth: Payer: Self-pay

## 2020-08-22 ENCOUNTER — Other Ambulatory Visit: Payer: Self-pay | Admitting: Family Medicine

## 2020-08-22 MED ORDER — LISINOPRIL 40 MG PO TABS: 40.0000 mg | ORAL_TABLET | Freq: Two times a day (BID) | ORAL | 0 refills | Status: DC

## 2020-08-22 NOTE — Telephone Encounter (Signed)
Stop valsartan/hct.  Resent lisinopril 40 mg one twice a day. Kc

## 2020-08-22 NOTE — Telephone Encounter (Signed)
Patient states over the weekend she took her Valsartan HCT as prescribed and her bp was high and she had a headache, top number was in the 150s and 160s. She states she spoke with the doctor on call Saturday 12/18 and they told her to take a second pill and she did and she felt better for about 30 mins and then her bp was up again. Patient states she took Lisinopril yesterday and this morning instead and she feels fine. She is requesting enough Lisinopril to last her until the beginning of the year until she can come in to see you if she needs to. Please advise.

## 2020-08-22 NOTE — Telephone Encounter (Signed)
Patient informed. States she will call back at the beginning of the year to schedule appt

## 2020-10-24 ENCOUNTER — Other Ambulatory Visit: Payer: Self-pay | Admitting: Family Medicine

## 2020-10-26 DIAGNOSIS — Z1231 Encounter for screening mammogram for malignant neoplasm of breast: Secondary | ICD-10-CM | POA: Diagnosis not present

## 2020-10-28 ENCOUNTER — Telehealth: Payer: Self-pay

## 2020-10-28 ENCOUNTER — Ambulatory Visit (INDEPENDENT_AMBULATORY_CARE_PROVIDER_SITE_OTHER): Payer: BC Managed Care – PPO | Admitting: Nurse Practitioner

## 2020-10-28 ENCOUNTER — Encounter: Payer: Self-pay | Admitting: Nurse Practitioner

## 2020-10-28 VITALS — BP 162/74 | HR 73 | Temp 98.7°F | Resp 18 | Ht 63.0 in | Wt 190.0 lb

## 2020-10-28 DIAGNOSIS — J029 Acute pharyngitis, unspecified: Secondary | ICD-10-CM

## 2020-10-28 DIAGNOSIS — R059 Cough, unspecified: Secondary | ICD-10-CM

## 2020-10-28 DIAGNOSIS — J020 Streptococcal pharyngitis: Secondary | ICD-10-CM

## 2020-10-28 DIAGNOSIS — R519 Headache, unspecified: Secondary | ICD-10-CM

## 2020-10-28 DIAGNOSIS — H6503 Acute serous otitis media, bilateral: Secondary | ICD-10-CM

## 2020-10-28 LAB — POC COVID19 BINAXNOW: SARS Coronavirus 2 Ag: NEGATIVE

## 2020-10-28 LAB — POCT INFLUENZA A/B
Influenza A, POC: NEGATIVE
Influenza B, POC: NEGATIVE

## 2020-10-28 LAB — POCT RAPID STREP A (OFFICE): Rapid Strep A Screen: POSITIVE — AB

## 2020-10-28 MED ORDER — ALBUTEROL SULFATE HFA 108 (90 BASE) MCG/ACT IN AERS: 2.0000 | INHALATION_SPRAY | Freq: Four times a day (QID) | RESPIRATORY_TRACT | 0 refills | Status: DC | PRN

## 2020-10-28 MED ORDER — AMOXICILLIN-POT CLAVULANATE 875-125 MG PO TABS: 1.0000 | ORAL_TABLET | Freq: Two times a day (BID) | ORAL | 0 refills | Status: DC

## 2020-10-28 NOTE — Patient Instructions (Addendum)
Warm salt water gargles and throat lozenges as needed Replace toothbrushes and toothpaste Augmentin twice daily for 10 days Rest and push fluids Continue Mucinex as directed  Notify office if no improvement or symptoms worsen  Strep Throat, Adult Strep throat is an infection of the throat. It is caused by germs (bacteria). Strep throat is common during the cold months of the year. It mostly affects children who are 605-59 years old. However, people of all ages can get it at any time of the year. When strep throat affects the tonsils, it is called tonsillitis. When it affects the back of the throat, it is called pharyngitis. This infection spreads from person to person through coughing, sneezing, or having close contact. What are the causes? This condition is caused by the Streptococcus pyogenes germ. What increases the risk? You are more likely to develop this condition if:  You care for young children. Children are more likely to get strep throat and may spread it to others.  You go to crowded places. Germs can spread easily in such places.  You kiss or touch someone who has strep throat. What are the signs or symptoms? Symptoms of this condition include:  Fever or chills.  Redness, swelling, or pain in the tonsils or throat.  Pain or trouble when swallowing.  White or yellow spots on the tonsils or throat.  Tender glands in the neck and under the jaw.  Bad breath.  Red rash all over the body. This is rare. How is this treated? This condition may be treated with:  Medicines that kill germs (antibiotics).  Medicines that treat pain or fever. These include: ? Ibuprofen or acetaminophen. ? Aspirin, only for patients who are over the age of 59. ? Throat lozenges. ? Throat sprays. Follow these instructions at home: Medicines  Take over-the-counter and prescription medicines only as told by your doctor.  Take your antibiotic medicine as told by your doctor. Do not stop  taking the antibiotic even if you start to feel better.   Eating and drinking  If you have trouble swallowing, eat soft foods until your throat feels better.  Drink enough fluid to keep your pee (urine) pale yellow.  To help with pain, you may have: ? Warm fluids, such as soup and tea. ? Cold fluids, such as frozen desserts or popsicles.   General instructions  Rinse your mouth (gargle) with a salt-water mixture 3-4 times a day or as needed. To make a salt-water mixture, dissolve -1 tsp (3-6 g) of salt in 1 cup (237 mL) of warm water.  Rest as much as you can.  Stay home from work or school until you have been taking antibiotics for 24 hours.  Avoid smoking or being around people who smoke.  Keep all follow-up visits as told by your doctor. This is important. How is this prevented?  Do not share food, drinking cups, or personal items. They can cause the germs to spread.  Wash your hands well with soap and water. Make sure that all people in your house wash their hands well.  Have family members tested if they have a fever or a sore throat. They may need an antibiotic if they have strep throat.   Contact a doctor if:  You have swelling in your neck that keeps getting bigger.  You get a rash, cough, or earache.  You cough up a thick fluid that is green, yellow-brown, or bloody.  You have pain that does not get better with  medicine.  Your symptoms get worse instead of getting better.  You have a fever. Get help right away if:  You vomit.  You have a very bad headache.  Your neck hurts or feels stiff.  You have chest pain or are short of breath.  You have drooling, very bad throat pain, or changes in your voice.  Your neck is swollen, or the skin gets red and tender.  Your mouth is dry, or you are peeing less than normal.  You keep feeling more tired or have trouble waking up.  Your joints are red or painful. Summary  Strep throat is an infection of the  throat. It is caused by germs (bacteria).  This infection can spread from person to person through coughing, sneezing, or having close contact.  Take your medicines, including antibiotics, as told by your doctor. Do not stop taking the antibiotic even if you start to feel better.  To prevent the spread of germs, wash your hands well with soap and water. Have others do the same. Do not share food, drinking cups, or personal items.  Get help right away if you have a bad headache, chest pain, shortness of breath, a stiff or painful neck, or you vomit. This information is not intended to replace advice given to you by your health care provider. Make sure you discuss any questions you have with your health care provider. Document Revised: 11/07/2018 Document Reviewed: 11/07/2018 Else[employer] Patient Education  2021 [employer] Sinusitis, Adult Sinusitis is soreness and swelling (inflammation) of your sinuses. Sinuses are hollow spaces in the bones around your face. They are located:  Around your eyes.  In the middle of your forehead.  Behind your nose.  In your cheekbones. Your sinuses and nasal passages are lined with a fluid called mucus. Mucus drains out of your sinuses. Swelling can trap mucus in your sinuses. This lets germs (bacteria, virus, or fungus) grow, which leads to infection. Most of the time, this condition is caused by a virus. What are the causes? This condition is caused by:  Allergies.  Asthma.  Germs.  Things that block your nose or sinuses.  Growths in the nose (nasal polyps).  Chemicals or irritants in the air.  Fungus (rare). What increases the risk? You are more likely to develop this condition if:  You have a weak body defense system (immune system).  You do a lot of swimming or diving.  You use nasal sprays too much.  You smoke. What are the signs or symptoms? The main symptoms of this condition are pain and a feeling of pressure around the  sinuses. Other symptoms include:  Stuffy nose (congestion).  Runny nose (drainage).  Swelling and warmth in the sinuses.  Headache.  Toothache.  A cough that may get worse at night.  Mucus that collects in the throat or the back of the nose (postnasal drip).  Being unable to smell and taste.  Being very tired (fatigue).  A fever.  Sore throat.  Bad breath. How is this diagnosed? This condition is diagnosed based on:  Your symptoms.  Your medical history.  A physical exam.  Tests to find out if your condition is short-term (acute) or long-term (chronic). Your doctor may: ? Check your nose for growths (polyps). ? Check your sinuses using a tool that has a light (endoscope). ? Check for allergies or germs. ? Do imaging tests, such as an MRI or CT scan. How is this treated? Treatment for this condition depends  on the cause and whether it is short-term or long-term.  If caused by a virus, your symptoms should go away on their own within 10 days. You may be given medicines to relieve symptoms. They include: ? Medicines that shrink swollen tissue in the nose. ? Medicines that treat allergies (antihistamines). ? A spray that treats swelling of the nostrils. ? Rinses that help get rid of thick mucus in your nose (nasal saline washes).  If caused by bacteria, your doctor may wait to see if you will get better without treatment. You may be given antibiotic medicine if you have: ? A very bad infection. ? A weak body defense system.  If caused by growths in the nose, you may need to have surgery. Follow these instructions at home: Medicines  Take, use, or apply over-the-counter and prescription medicines only as told by your doctor. These may include nasal sprays.  If you were prescribed an antibiotic medicine, take it as told by your doctor. Do not stop taking the antibiotic even if you start to feel better. Hydrate and humidify  Drink enough water to keep your pee  (urine) pale yellow.  Use a cool mist humidifier to keep the humidity level in your home above 50%.  Breathe in steam for 10-15 minutes, 3-4 times a day, or as told by your doctor. You can do this in the bathroom while a hot shower is running.  Try not to spend time in cool or dry air.   Rest  Rest as much as you can.  Sleep with your head raised (elevated).  Make sure you get enough sleep each night. General instructions  Put a warm, moist washcloth on your face 3-4 times a day, or as often as told by your doctor. This will help with discomfort.  Wash your hands often with soap and water. If there is no soap and water, use hand sanitizer.  Do not smoke. Avoid being around people who are smoking (secondhand smoke).  Keep all follow-up visits as told by your doctor. This is important.   Contact a doctor if:  You have a fever.  Your symptoms get worse.  Your symptoms do not get better within 10 days. Get help right away if:  You have a very bad headache.  You cannot stop throwing up (vomiting).  You have very bad pain or swelling around your face or eyes.  You have trouble seeing.  You feel confused.  Your neck is stiff.  You have trouble breathing. Summary  Sinusitis is swelling of your sinuses. Sinuses are hollow spaces in the bones around your face.  This condition is caused by tissues in your nose that become inflamed or swollen. This traps germs. These can lead to infection.  If you were prescribed an antibiotic medicine, take it as told by your doctor. Do not stop taking it even if you start to feel better.  Keep all follow-up visits as told by your doctor. This is important. This information is not intended to replace advice given to you by your health care provider. Make sure you discuss any questions you have with your health care provider. Document Revised: 01/20/2018 Document Reviewed: 01/20/2018 Else[employer] Patient Education  2021 [employer] Otitis  Media, Adult  Otitis media is a condition in which the middle ear is red and swollen (inflamed) and full of fluid. The middle ear is the part of the ear that contains bones for hearing as well as air that helps send sounds to  the brain. The condition usually goes away on its own. What are the causes? This condition is caused by a blockage in the eustachian tube. The eustachian tube connects the middle ear to the back of the nose. It normally allows air into the middle ear. The blockage is caused by fluid or swelling. Problems that can cause blockage include:  A cold or infection that affects the nose, mouth, or throat.  Allergies.  An irritant, such as tobacco smoke.  Adenoids that have become large. The adenoids are soft tissue located in the back of the throat, behind the nose and the roof of the mouth.  Growth or swelling in the upper part of the throat, just behind the nose (nasopharynx).  Damage to the ear caused by change in pressure. This is called barotrauma. What are the signs or symptoms? Symptoms of this condition include:  Ear pain.  Fever.  Problems with hearing.  Being tired.  Fluid leaking from the ear.  Ringing in the ear. How is this treated? This condition can go away on its own within 3-5 days. But if the condition is caused by bacteria or does not go away on its own, or if it keeps coming back, your doctor may:  Give you antibiotic medicines.  Give you medicines for pain. Follow these instructions at home:  Take over-the-counter and prescription medicines only as told by your doctor.  If you were prescribed an antibiotic medicine, take it as told by your doctor. Do not stop taking the antibiotic even if you start to feel better.  Keep all follow-up visits as told by your doctor. This is important. Contact a doctor if:  You have bleeding from your nose.  There is a lump on your neck.  You are not feeling better in 5 days.  You feel worse instead  of better. Get help right away if:  You have pain that is not helped with medicine.  You have swelling, redness, or pain around your ear.  You get a stiff neck.  You cannot move part of your face (paralysis).  You notice that the bone behind your ear hurts when you touch it.  You get a very bad headache. Summary  Otitis media means that the middle ear is red, swollen, and full of fluid.  This condition usually goes away on its own.  If the problem does not go away, treatment may be needed. You may be given medicines to treat the infection or to treat your pain.  If you were prescribed an antibiotic medicine, take it as told by your doctor. Do not stop taking the antibiotic even if you start to feel better.  Keep all follow-up visits as told by your doctor. This is important. This information is not intended to replace advice given to you by your health care provider. Make sure you discuss any questions you have with your health care provider. Document Revised: 07/23/2019 Document Reviewed: 07/23/2019 Elsevier Patient Education  2021 [employer] Amoxicillin; Clavulanic Acid Tablets What is this medicine? AMOXICILLIN; CLAVULANIC ACID (a mox i SIL in; KLAV yoo lan ic AS id) is a penicillin antibiotic. It treats some infections caused by bacteria. It will not work for colds, the flu, or other viruses. This medicine may be used for other purposes; ask your health care provider or pharmacist if you have questions. COMMON BRAND NAME(S): Augmentin What should I tell my health care provider before I take this medicine? They need to know if you have any  of these conditions:  kidney disease  liver disease  mononucleosis  stomach or intestine problems such as colitis  an unusual or allergic reaction to amoxicillin, other penicillin or cephalosporin antibiotics, clavulanic acid, other medicines, foods, dyes, or preservatives  pregnant or trying to get  pregnant  breast-feeding How should I use this medicine? Take this drug by mouth. Take it as directed on the prescription label at the same time every day. Take it with food at the start of a meal or snack. Take all of this drug unless your health care provider tells you to stop it early. Keep taking it even if you think you are better. Talk to your health care provider about the use of this drug in children. While it may be prescribed for selected conditions, precautions do apply. Overdosage: If you think you have taken too much of this medicine contact a poison control center or emergency room at once. NOTE: This medicine is only for you. Do not share this medicine with others. What if I miss a dose? If you miss a dose, take it as soon as you can. If it is almost time for your next dose, take only that dose. Do not take double or extra doses. What may interact with this medicine?  allopurinol  anticoagulants  birth control pills  methotrexate  probenecid This list may not describe all possible interactions. Give your health care provider a list of all the medicines, herbs, non-prescription drugs, or dietary supplements you use. Also tell them if you smoke, drink alcohol, or use illegal drugs. Some items may interact with your medicine. What should I watch for while using this medicine? Tell your health care provider if your symptoms do not start to get better or if they get worse. This medicine may cause serious skin reactions. They can happen weeks to months after starting the medicine. Contact your health care provider right away if you notice fevers or flu-like symptoms with a rash. The rash may be red or purple and then turn into blisters or peeling of the skin. Or, you might notice a red rash with swelling of the face, lips or lymph nodes in your neck or under your arms. Do not treat diarrhea with over the counter products. Contact your health care provider if you have diarrhea that  lasts more than 2 days or if it is severe and watery. If you have diabetes, you may get a false-positive result for sugar in your urine. Check with your health care provider. Birth control may not work properly while you are taking this medicine. Talk to your health care provider about using an extra method of birth control. What side effects may I notice from receiving this medicine? Side effects that you should report to your doctor or health care provider as soon as possible:  allergic reactions (skin rash, itching or hives; swelling of the face, lips, or tongue)  bloody or watery diarrhea  dark urine  infection (fever, chills, cough, sore throat, or pain)  kidney injury (trouble passing urine or change in the amount of urine)  redness, blistering, peeling, or loosening of the skin, including inside the mouth  seizures  thrush (white patches in the mouth or mouth sores)  trouble breathing  unusual bruising or bleeding  unusually weak or tired Side effects that usually do not require medical attention (report to your doctor or health care provider if they continue or are bothersome):  diarrhea  dizziness  headache  nausea, vomiting  unusual vaginal discharge, itching, or odor  upset stomach This list may not describe all possible side effects. Call your doctor for medical advice about side effects. You may report side effects to FDA at 1-800-FDA-1088. Where should I keep my medicine? Keep out of the reach of children and pets. Store at room temperature between 20 and 25 degrees C (68 and 77 degrees F). Throw away any unused drug after the expiration date. NOTE: This sheet is a summary. It may not cover all possible information. If you have questions about this medicine, talk to your doctor, pharmacist, or health care provider.  2021 [employer] (2020-07-13 09:45:03)

## 2020-10-28 NOTE — Progress Notes (Signed)
Acute Office Visit  Subjective:    Patient ID: Caroline Kelly, female    DOB: 04-Apr-1961, 60 y.o.   MRN: [id number]  Chief Complaint  Patient presents with  . Headache  . Sore Throat  . Cough  . Nasal Congestion    HPI Patient is in today for sinus congestion, nasal congestion/pain/pressure, bilateral ear pain, and sore throat. States symptoms developed 1-week ago. Treatment has included Mucinex, Flonase nasal spray, and pushing fluids. She tells me sore throat became severe yesterday. She has been exposed to grandchildren with URI symptoms. States her family had COVID-19 in December 2021. She has history of chronic allergic rhinitis, states fall and winter seasons sinus symptoms worsen.   Past Medical History:  Diagnosis Date  . Allergy   . GERD (gastroesophageal reflux disease)   . Hyperlipidemia   . Hypertension     Past Surgical History:  Procedure Laterality Date  . CESAREAN SECTION    . ENDOMETRIAL ABLATION  1989    Family History  Problem Relation Age of Onset  . Heart failure Mother   . Dementia Mother   . Hyperlipidemia Father   . Hypertension Father   . CAD Father   . Hypertension Sister   . Hypertension Brother     Social History   Socioeconomic History  . Marital status: Married    Spouse name: Not on file  . Number of children: Not on file  . Years of education: Not on file  . Highest education level: Not on file  Occupational History  . Not on file  Tobacco Use  . Smoking status: Former Smoker    Types: Cigarettes    Quit date: 09/2017    Years since quitting: 3.1  . Smokeless tobacco: Never Used  Substance and Sexual Activity  . Alcohol use: Yes    Comment: twice monthly  . Drug use: Never  . Sexual activity: Not on file  Other Topics Concern  . Not on file  Social History Narrative  . Not on file   Social Determinants of Health   Financial Resource Strain: Not on file  Food Insecurity: Not on file  Transportation Needs: Not on  file  Physical Activity: Not on file  Stress: Not on file  Social Connections: Not on file  Intimate Partner Violence: Not on file    Outpatient Medications Prior to Visit  Medication Sig Dispense Refill  . Ascorbic Acid (VITAMIN C PO) Take by mouth.    Marland Kitchen atorvastatin (LIPITOR) 40 MG tablet Take 1 tablet (40 mg total) by mouth daily. 90 tablet 0  . fluticasone (FLONASE) 50 MCG/ACT nasal spray Place 2 sprays into both nostrils daily. 16 g 6  . levothyroxine (SYNTHROID) 25 MCG tablet TAKE ONE TABLET BY MOUTH ONCE DAILY 90 tablet 3  . lisinopril (ZESTRIL) 40 MG tablet Take 1 tablet (40 mg total) by mouth 2 (two) times daily. 180 tablet 0  . loratadine (CLARITIN) 10 MG tablet Take 1 tablet (10 mg total) by mouth daily. 90 tablet 1  . Probiotic Product (PROBIOTIC-10 PO) Take by mouth.    Marland Kitchen VITAMIN D PO Take by mouth.    . cefdinir (OMNICEF) 300 MG capsule Take 1 capsule (300 mg total) by mouth 2 (two) times daily. 20 capsule 0   No facility-administered medications prior to visit.    Allergies  Allergen Reactions  . Levofloxacin Hives    Review of Systems  Constitutional: Positive for chills. Negative for fatigue and fever.  HENT: Positive for ear pain (bilateral ear pressur L>R), postnasal drip, rhinorrhea, sinus pressure, sinus pain (sinus tenderness ) and sore throat.   Respiratory: Positive for cough (non-productive) and shortness of breath (mild with activity).   Cardiovascular: Negative for chest pain, palpitations and leg swelling.  Gastrointestinal: Negative for constipation, diarrhea, nausea and vomiting.  Endocrine: Negative for cold intolerance, heat intolerance, polydipsia, polyphagia and polyuria.  Genitourinary: Negative for dysuria, frequency and urgency.  Musculoskeletal: Negative for arthralgias, back pain, joint swelling and myalgias.  Skin: Negative.   Allergic/Immunologic: Positive for environmental allergies.  Neurological: Positive for headaches.   Hematological: Negative for adenopathy.       Objective:    Physical Exam Vitals reviewed.  Constitutional:      Appearance: Normal appearance. She is well-developed.  HENT:     Head: Normocephalic.     Right Ear: Tympanic membrane is erythematous.     Left Ear: Tenderness present. Tympanic membrane is erythematous.     Nose: Congestion and rhinorrhea present.     Mouth/Throat:     Mouth: Mucous membranes are moist. No oral lesions.     Pharynx: Posterior oropharyngeal erythema present. No pharyngeal swelling, oropharyngeal exudate or uvula swelling.  Cardiovascular:     Rate and Rhythm: Normal rate and regular rhythm.     Pulses: Normal pulses.     Heart sounds: Normal heart sounds.  Abdominal:     Palpations: Abdomen is soft.  Musculoskeletal:     Cervical back: Neck supple.  Lymphadenopathy:     Cervical: No cervical adenopathy.  Skin:    General: Skin is warm and dry.     Capillary Refill: Capillary refill takes less than 2 seconds.  Neurological:     Mental Status: She is alert and oriented to person, place, and time.  Psychiatric:        Mood and Affect: Mood normal.        Behavior: Behavior normal.     There were no vitals taken for this visit. Wt Readings from Last 3 Encounters:  08/10/20 192 lb (87.1 kg)  08/03/20 182 lb (82.6 kg)  03/21/20 182 lb 6.4 oz (82.7 kg)    Health Maintenance Due  Topic Date Due  . Hepatitis C Screening  Never done  . HIV Screening  Never done  . PAP SMEAR-Modifier  Never done  . COLONOSCOPY (Pts 45-68yrs Insurance coverage will need to be confirmed)  Never done  . MAMMOGRAM  Never done  . TETANUS/TDAP  09/03/2020     Lab Results  Component Value Date   TSH 2.200 08/10/2020   Lab Results  Component Value Date   WBC 5.3 08/10/2020   HGB 14.7 08/10/2020   HCT 43.8 08/10/2020   MCV 87 08/10/2020   PLT 260 08/10/2020   Lab Results  Component Value Date   NA 144 08/10/2020   K 4.0 08/10/2020   CO2 25 08/10/2020    GLUCOSE 90 08/10/2020   BUN 15 08/10/2020   CREATININE 0.88 08/10/2020   BILITOT 0.2 08/10/2020   ALKPHOS 74 08/10/2020   AST 30 08/10/2020   ALT 26 08/10/2020   PROT 7.2 08/10/2020   ALBUMIN 4.6 08/10/2020   CALCIUM 10.1 08/10/2020   Lab Results  Component Value Date   CHOL 300 (H) 08/10/2020   Lab Results  Component Value Date   HDL 65 08/10/2020   Lab Results  Component Value Date   LDLCALC 221 (H) 08/10/2020   Lab Results  Component Value  Date   TRIG 84 08/10/2020   Lab Results  Component Value Date   CHOLHDL 4.6 (H) 08/10/2020   No results found for: HGBA1C     Assessment & Plan:   1. Strep pharyngitis - amoxicillin-clavulanate (AUGMENTIN) 875-125 MG tablet; Take 1 tablet by mouth 2 (two) times daily for 10 days.  Dispense: 20 tablet; Refill: 0  2. Cough - POC COVID-19 - Influenza A/B  3. Sore throat - POC COVID-19 - Rapid Strep A - Influenza A/B  4. Acute nonintractable headache, unspecified headache type - POC COVID-19 - Influenza A/B  5. Bilateral acute serous otitis media, recurrence not specified - amoxicillin-clavulanate (AUGMENTIN) 875-125 MG tablet; Take 1 tablet by mouth 2 (two) times daily for 10 days.  Dispense: 20 tablet; Refill: 0   Warm salt water gargles and throat lozenges as needed Replace toothbrushes and toothpaste Augmentin twice daily for 10 days Rest and push fluids Continue Mucinex as directed  Notify office if no improvement or symptoms worsen   Follow-up: As needed if no improvement or symptoms worsen   Signed,   Janie Morning, NP

## 2020-10-28 NOTE — Telephone Encounter (Signed)
Pt called stating she has been trying to treat sinus problems with mucinex and flonase but it has moved to chest. States she has hx of pneumonia. Complains of cough("tainted" w blood), headache, chills, sore throat,  Denies fever, diarrhea, urinary symptoms, nausea,   Granddaughter had deep cough this week. Daughter has had sore throat. Pt is going to [place] next week.    Pt is coming to office, She will be covid tested and strep tested before entering the clinic.   Lorita Officer, [place] 10/28/20 8:55 AM

## 2020-11-04 ENCOUNTER — Ambulatory Visit (INDEPENDENT_AMBULATORY_CARE_PROVIDER_SITE_OTHER): Payer: BC Managed Care – PPO | Admitting: Nurse Practitioner

## 2020-11-04 ENCOUNTER — Encounter: Payer: Self-pay | Admitting: Nurse Practitioner

## 2020-11-04 VITALS — BP 158/92 | HR 88 | Temp 97.8°F | Ht 63.0 in | Wt 190.0 lb

## 2020-11-04 DIAGNOSIS — H6123 Impacted cerumen, bilateral: Secondary | ICD-10-CM | POA: Diagnosis not present

## 2020-11-04 DIAGNOSIS — I10 Essential (primary) hypertension: Secondary | ICD-10-CM | POA: Diagnosis not present

## 2020-11-04 DIAGNOSIS — J0100 Acute maxillary sinusitis, unspecified: Secondary | ICD-10-CM | POA: Diagnosis not present

## 2020-11-04 LAB — POC COVID19 BINAXNOW: SARS Coronavirus 2 Ag: NEGATIVE

## 2020-11-04 MED ORDER — AZITHROMYCIN 250 MG PO TABS: ORAL_TABLET | ORAL | 0 refills | Status: DC

## 2020-11-04 MED ORDER — CEFTRIAXONE SODIUM 1 G IJ SOLR
1.0000 g | Freq: Once | INTRAMUSCULAR | Status: AC
  Administered 2020-11-04: 1 g via INTRAMUSCULAR

## 2020-11-04 MED ORDER — CHLORTHALIDONE 25 MG PO TABS: 25.0000 mg | ORAL_TABLET | Freq: Every day | ORAL | 0 refills | Status: DC

## 2020-11-04 MED ORDER — CLONIDINE HCL 0.1 MG PO TABS
0.1000 mg | ORAL_TABLET | Freq: Once | ORAL | Status: AC
  Administered 2020-11-04: 0.1 mg via ORAL

## 2020-11-04 NOTE — Patient Instructions (Addendum)
Stop Augmentin Begin Zpak as directed Continue Flonase and Claritin daily Begin Chlorthalidone 25 mg daily for hypertension Monitor BP at home Keep scheduled follow-up appointment with Dr Sedalia Muta 11/17/20 Notify office if no improvement   Managing Your Hypertension Hypertension, also called high blood pressure, is when the force of the blood pressing against the walls of the arteries is too strong. Arteries are blood vessels that carry blood from your heart throughout your body. Hypertension forces the heart to work harder to pump blood and may cause the arteries to become narrow or stiff. Understanding blood pressure readings Your personal target blood pressure may vary depending on your medical conditions, your age, and other factors. A blood pressure reading includes a higher number over a lower number. Ideally, your blood pressure should be below 120/80. You should know that:  The first, or top, number is called the systolic pressure. It is a measure of the pressure in your arteries as your heart beats.  The second, or bottom number, is called the diastolic pressure. It is a measure of the pressure in your arteries as the heart relaxes. Blood pressure is classified into four stages. Based on your blood pressure reading, your health care provider may use the following stages to determine what type of treatment you need, if any. Systolic pressure and diastolic pressure are measured in a unit called mmHg. Normal  Systolic pressure: below 120.  Diastolic pressure: below 80. Elevated  Systolic pressure: 120-129.  Diastolic pressure: below 80. Hypertension stage 1  Systolic pressure: 130-139.  Diastolic pressure: 80-89. Hypertension stage 2  Systolic pressure: 140 or above.  Diastolic pressure: 90 or above. How can this condition affect me? Managing your hypertension is an important responsibility. Over time, hypertension can damage the arteries and decrease blood flow to important  parts of the body, including the brain, heart, and kidneys. Having untreated or uncontrolled hypertension can lead to:  A heart attack.  A stroke.  A weakened blood vessel (aneurysm).  Heart failure.  Kidney damage.  Eye damage.  Metabolic syndrome.  Memory and concentration problems.  Vascular dementia. What actions can I take to manage this condition? Hypertension can be managed by making lifestyle changes and possibly by taking medicines. Your health care provider will help you make a plan to bring your blood pressure within a normal range. Nutrition  Eat a diet that is high in fiber and potassium, and low in salt (sodium), added sugar, and fat. An example eating plan is called the Dietary Approaches to Stop Hypertension (DASH) diet. To eat this way: ? Eat plenty of fresh fruits and vegetables. Try to fill one-half of your plate at each meal with fruits and vegetables. ? Eat whole grains, such as whole-wheat pasta, brown rice, or whole-grain bread. Fill about one-fourth of your plate with whole grains. ? Eat low-fat dairy products. ? Avoid fatty cuts of meat, processed or cured meats, and poultry with skin. Fill about one-fourth of your plate with lean proteins such as fish, chicken without skin, beans, eggs, and tofu. ? Avoid pre-made and processed foods. These tend to be higher in sodium, added sugar, and fat.  Reduce your daily sodium intake. Most people with hypertension should eat less than 1,500 mg of sodium a day.   Lifestyle  Work with your health care provider to maintain a healthy body weight or to lose weight. Ask what an ideal weight is for you.  Get at least 30 minutes of exercise that causes your heart to  beat faster (aerobic exercise) most days of the week. Activities may include walking, swimming, or biking.  Include exercise to strengthen your muscles (resistance exercise), such as weight lifting, as part of your weekly exercise routine. Try to do these types  of exercises for 30 minutes at least 3 days a week.  Do not use any products that contain nicotine or tobacco, such as cigarettes, e-cigarettes, and chewing tobacco. If you need help quitting, ask your health care provider.  Control any long-term (chronic) conditions you have, such as high cholesterol or diabetes.  Identify your sources of stress and find ways to manage stress. This may include meditation, deep breathing, or making time for fun activities.   Alcohol use  Do not drink alcohol if: ? Your health care provider tells you not to drink. ? You are pregnant, may be pregnant, or are planning to become pregnant.  If you drink alcohol: ? Limit how much you use to:  0-1 drink a day for women.  0-2 drinks a day for men. ? Be aware of how much alcohol is in your drink. In the [place] one drink equals one 12 oz bottle of beer (355 mL), one 5 oz glass of wine (148 mL), or one 1 oz glass of hard liquor (44 mL). Medicines Your health care provider may prescribe medicine if lifestyle changes are not enough to get your blood pressure under control and if:  Your systolic blood pressure is 130 or higher.  Your diastolic blood pressure is 80 or higher. Take medicines only as told by your health care provider. Follow the directions carefully. Blood pressure medicines must be taken as told by your health care provider. The medicine does not work as well when you skip doses. Skipping doses also puts you at risk for problems. Monitoring Before you monitor your blood pressure:  Do not smoke, drink caffeinated beverages, or exercise within 30 minutes before taking a measurement.  Use the bathroom and empty your bladder (urinate).  Sit quietly for at least 5 minutes before taking measurements. Monitor your blood pressure at home as told by your health care provider. To do this:  Sit with your back straight and supported.  Place your feet flat on the floor. Do not cross your legs.  Support  your arm on a flat surface, such as a table. Make sure your upper arm is at heart level.  Each time you measure, take two or three readings one minute apart and record the results. You may also need to have your blood pressure checked regularly by your health care provider.   General information  Talk with your health care provider about your diet, exercise habits, and other lifestyle factors that may be contributing to hypertension.  Review all the medicines you take with your health care provider because there may be side effects or interactions.  Keep all visits as told by your health care provider. Your health care provider can help you create and adjust your plan for managing your high blood pressure. Where to find more information  National Heart, Lung, and Blood Institute: PopSteam.iswww.nhlbi.nih.gov  American Heart Association: www.heart.org Contact a health care provider if:  You think you are having a reaction to medicines you have taken.  You have repeated (recurrent) headaches.  You feel dizzy.  You have swelling in your ankles.  You have trouble with your vision. Get help right away if:  You develop a severe headache or confusion.  You have unusual weakness or numbness,  or you feel faint.  You have severe pain in your chest or abdomen.  You vomit repeatedly.  You have trouble breathing. These symptoms may represent a serious problem that is an emergency. Do not wait to see if the symptoms will go away. Get medical help right away. Call your local emergency services (911 in the [place] Do not drive yourself to the hospital. Summary  Hypertension is when the force of blood pumping through your arteries is too strong. If this condition is not controlled, it may put you at risk for serious complications.  Your personal target blood pressure may vary depending on your medical conditions, your age, and other factors. For most people, a normal blood pressure is less than  120/80.  Hypertension is managed by lifestyle changes, medicines, or both.  Lifestyle changes to help manage hypertension include losing weight, eating a healthy, low-sodium diet, exercising more, stopping smoking, and limiting alcohol. This information is not intended to replace advice given to you by your health care provider. Make sure you discuss any questions you have with your health care provider. Document Revised: 09/25/2019 Document Reviewed: 07/21/2019 Elsevier Patient Education  2021 Elsevier Inc.  Blood Pressure Record Sheet To take your blood pressure, you will need a blood pressure machine. You can buy a blood pressure machine (blood pressure monitor) at your clinic, drug store, or online. When choosing one, consider:  An automatic monitor that has an arm cuff.  A cuff that wraps snugly around your upper arm. You should be able to fit only one finger between your arm and the cuff.  A device that stores blood pressure reading results.  Do not choose a monitor that measures your blood pressure from your wrist or finger. Follow your health care provider's instructions for how to take your blood pressure. To use this form:  Get one reading in the morning (a.m.) before you take any medicines.  Get one reading in the evening (p.m.) before supper.  Take at least 2 readings with each blood pressure check. This makes sure the results are correct. Wait 1-2 minutes between measurements.  Write down the results in the spaces on this form.  Repeat this once a week, or as told by your health care provider.  Make a follow-up appointment with your health care provider to discuss the results. Blood pressure log Date: _______________________  a.m. _____________________(1st reading) _____________________(2nd reading)  p.m. _____________________(1st reading) _____________________(2nd reading) Date: _______________________  a.m. _____________________(1st reading)  _____________________(2nd reading)  p.m. _____________________(1st reading) _____________________(2nd reading) Date: _______________________  a.m. _____________________(1st reading) _____________________(2nd reading)  p.m. _____________________(1st reading) _____________________(2nd reading) Date: _______________________  a.m. _____________________(1st reading) _____________________(2nd reading)  p.m. _____________________(1st reading) _____________________(2nd reading) Date: _______________________  a.m. _____________________(1st reading) _____________________(2nd reading)  p.m. _____________________(1st reading) _____________________(2nd reading) This information is not intended to replace advice given to you by your health care provider. Make sure you discuss any questions you have with your health care provider. Document Revised: 12/09/2019 Document Reviewed: 12/09/2019 Elsevier Patient Education  2021 Elsevier Inc. Chlorthalidone Oral Tablets What is this medicine? CHLORTHALIDONE (klor THAL i done) is a diuretic. It helps you make more urine and to lose salt and excess water from your body. It treats swelling from heart, kidney, or liver disease. It also treats high blood pressure. This medicine may be used for other purposes; ask your health care provider or pharmacist if you have questions. COMMON BRAND NAME(S): Thalitone What should I tell my health care provider before I take this medicine? They need  to know if you have any of these conditions:  diabetes, high blood sugar  gout  kidney disease  liver disease  lung or breathing disease (asthma)  lupus  an unusual or allergic reaction to chlorthalidone, sulfa drugs, other drugs, foods, dyes or preservatives  pregnant or trying to get pregnant  breast-feeding How should I use this medicine? Take this drug by mouth. Take it as directed on the prescription label at the same time every day. Take it with food. Keep  taking it unless your health care provider tells you to stop. Talk to your health care provider about the use of this drug in children. Special care may be needed. Overdosage: If you think you have taken too much of this medicine contact a poison control center or emergency room at once. NOTE: This medicine is only for you. Do not share this medicine with others. What if I miss a dose? If you miss a dose, take it as soon as you can. If it is almost time for your next dose, take only that dose. Do not take double or extra doses. What may interact with this medicine?  barbiturate medicines for sleep or seizure control  digoxin  lithium  medicines for diabetes  norepinephrine  other medicines for high blood pressure  some pain medicines  steroid hormones like prednisone, cortisone, hydrocortisone, corticotropin  tubocurarine This list may not describe all possible interactions. Give your health care provider a list of all the medicines, herbs, non-prescription drugs, or dietary supplements you use. Also tell them if you smoke, drink alcohol, or use illegal drugs. Some items may interact with your medicine. What should I watch for while using this medicine? Visit your health care provider for regular checks on your progress. Check your blood pressure as directed. Ask your health care provider what your blood pressure should be. Also, find out when you should contact him or her. Check with your health care provider if you have severe diarrhea, nausea, and vomiting, or if you sweat a lot. The loss of too much body fluid may make it dangerous for you to take this drug. You may need to be on a special diet while you are taking this drug. Ask your health care provider. Also, find out how many glasses of fluids you need to drink each day. Do not treat yourself for coughs, colds, or pain while you are using this drug without asking your health care provider for advice. Some drugs may increase your  blood pressure. You may get drowsy or dizzy. Do not drive, use machinery, or do anything that needs mental alertness until you know how this drug affects you. Do not stand up or sit up quickly, especially if you are an older patient. This reduces the risk of dizzy or fainting spells. Alcohol may interfere with the effect of this drug. Avoid alcoholic drinks. What side effects may I notice from receiving this medicine? Side effects that you should report to your doctor or health care provider as soon as possible:  allergic reactions (skin rash, itching or hives; swelling of the face, lips, or tongue)  kidney injury (trouble passing urine or change in the amount of urine)  low blood pressure (dizziness; feeling faint or lightheaded, falls; unusually weak or tired)  low potassium levels (trouble breathing; chest pain; dizziness; fast, irregular heartbeat; feeling faint or lightheaded, falls; muscle cramps or pain)  redness, blistering, peeling, or loosening of the skin, including inside the mouth  unusual bruising or bleeding  Side effects that usually do not require medical attention (report to your doctor or health care provider if they continue or are bothersome):  constipation  diarrhea  headache  increased thirst  loss of appetite  low red blood cell counts (trouble breathing; feeling faint; lightheaded, falls; unusually weak or tired)  unusual sweating  vomiting This list may not describe all possible side effects. Call your doctor for medical advice about side effects. You may report side effects to FDA at 1-800-FDA-1088. Where should I keep my medicine? Keep out of the reach of children and pets. Store at room temperature between 20 and 25 degrees C (68 and 77 degrees F). Protect from light. Throw away any unused drug after the expiration date. NOTE: This sheet is a summary. It may not cover all possible information. If you have questions about this medicine, talk to your  doctor, pharmacist, or health care provider.  2021 Elsevier/Gold Standard (2019-06-09 14:45:31)

## 2020-11-04 NOTE — Progress Notes (Signed)
Acute Office Visit  Subjective:    Patient ID: Caroline Kelly, female    DOB: 1961/01/27, 60 y.o.   MRN: [id number]  Chief Complaint  Patient presents with  . Sinus congestion and headache        HPI Caroline Kelly is a 60 year old Caucasian female in today for evaluation of sinus congestion, bilateral ear fullness, rhinorrhea, and post-nasal-drip. Pt was positive for strep pharyngitis on 10/28/20. She tells me sore throat has subsided but continues to have headache and sinus tenderness. She was prescribed a course of Augmentin which she is currently taking. Other treatment includes Mucinex and Claritin OTC. She is scheduled to fly to [place] tomorrow to visit family.   BP elevated upon arrival at 148/96. She has a history of hypertension. States she took prescribed Lisinopril 40 mg this am prior to arrival. She denies dizziness, nausea, or chest pain. She states she does have a headache. Clonidine was given in office but BP remained elevated with recheck readings of 168/102 and158/92.     Past Medical History:  Diagnosis Date  . Allergy   . GERD (gastroesophageal reflux disease)   . Hyperlipidemia   . Hypertension     Past Surgical History:  Procedure Laterality Date  . CESAREAN SECTION    . ENDOMETRIAL ABLATION  1989    Family History  Problem Relation Age of Onset  . Heart failure Mother   . Dementia Mother   . Hyperlipidemia Father   . Hypertension Father   . CAD Father   . Hypertension Sister   . Hypertension Brother     Social History   Socioeconomic History  . Marital status: Married    Spouse name: Not on file  . Number of children: Not on file  . Years of education: Not on file  . Highest education level: Not on file  Occupational History  . Not on file  Tobacco Use  . Smoking status: Former Smoker    Types: Cigarettes    Quit date: 09/2017    Years since quitting: 3.1  . Smokeless tobacco: Never Used  Substance and Sexual Activity  . Alcohol use: Yes     Comment: twice monthly  . Drug use: Never  . Sexual activity: Not on file  Other Topics Concern  . Not on file  Social History Narrative  . Not on file   Social Determinants of Health   Financial Resource Strain: Not on file  Food Insecurity: Not on file  Transportation Needs: Not on file  Physical Activity: Not on file  Stress: Not on file  Social Connections: Not on file  Intimate Partner Violence: Not on file    Outpatient Medications Prior to Visit  Medication Sig Dispense Refill  . albuterol (VENTOLIN HFA) 108 (90 Base) MCG/ACT inhaler Inhale 2 puffs into the lungs every 6 (six) hours as needed for wheezing or shortness of breath. 8 g 0  . amoxicillin-clavulanate (AUGMENTIN) 875-125 MG tablet Take 1 tablet by mouth 2 (two) times daily for 10 days. 20 tablet 0  . Ascorbic Acid (VITAMIN C PO) Take by mouth.    Marland Kitchen atorvastatin (LIPITOR) 40 MG tablet Take 1 tablet (40 mg total) by mouth daily. 90 tablet 0  . fluticasone (FLONASE) 50 MCG/ACT nasal spray Place 2 sprays into both nostrils daily. 16 g 6  . levothyroxine (SYNTHROID) 25 MCG tablet TAKE ONE TABLET BY MOUTH ONCE DAILY 90 tablet 3  . lisinopril (ZESTRIL) 40 MG tablet Take 1 tablet (40 mg  total) by mouth 2 (two) times daily. 180 tablet 0  . loratadine (CLARITIN) 10 MG tablet Take 1 tablet (10 mg total) by mouth daily. 90 tablet 1  . Probiotic Product (PROBIOTIC-10 PO) Take by mouth.    Marland Kitchen VITAMIN D PO Take by mouth.     No facility-administered medications prior to visit.    Allergies  Allergen Reactions  . Levofloxacin Hives    Review of Systems  Constitutional: Positive for fatigue. Negative for chills, diaphoresis and fever.  HENT: Positive for congestion, postnasal drip, rhinorrhea, sinus pressure and sinus pain. Negative for ear pain, sore throat and tinnitus.   Respiratory: Negative for cough and shortness of breath.   Cardiovascular: Negative for chest pain.  Gastrointestinal: Negative for abdominal pain,  constipation, diarrhea, nausea and rectal pain.  Musculoskeletal: Negative for arthralgias and myalgias.  Neurological: Positive for headaches.       Objective:    Physical Exam Vitals reviewed.  Constitutional:      Appearance: Normal appearance.  HENT:     Head: Normocephalic.     Right Ear: There is impacted cerumen.     Left Ear: There is impacted cerumen.     Nose: Congestion and rhinorrhea present.     Mouth/Throat:     Mouth: Mucous membranes are moist.  Eyes:     Pupils: Pupils are equal, round, and reactive to light.  Cardiovascular:     Rate and Rhythm: Normal rate and regular rhythm.     Pulses: Normal pulses.     Heart sounds: Normal heart sounds.  Abdominal:     General: Bowel sounds are normal.     Palpations: Abdomen is soft.  Musculoskeletal:     Cervical back: Neck supple.  Skin:    General: Skin is warm and dry.     Capillary Refill: Capillary refill takes less than 2 seconds.  Neurological:     General: No focal deficit present.     Mental Status: She is alert and oriented to person, place, and time.  Psychiatric:        Mood and Affect: Mood normal.        Behavior: Behavior normal.     BP (!) 148/96 (BP Location: Left Arm, Patient Position: Sitting)   Pulse 88   Temp 97.8 F (36.6 C) (Temporal)   Ht 5\' 3"  (1.6 m)   Wt 190 lb (86.2 kg)   SpO2 98%   BMI 33.66 kg/m  Wt Readings from Last 3 Encounters:  11/04/20 190 lb (86.2 kg)  10/28/20 190 lb (86.2 kg)  08/10/20 192 lb (87.1 kg)    Health Maintenance Due  Topic Date Due  . Hepatitis C Screening  Never done  . HIV Screening  Never done  . PAP SMEAR-Modifier  Never done  . COLONOSCOPY (Pts 45-94yrs Insurance coverage will need to be confirmed)  Never done  . TETANUS/TDAP  09/03/2020       Lab Results  Component Value Date   TSH 2.200 08/10/2020   Lab Results  Component Value Date   WBC 5.3 08/10/2020   HGB 14.7 08/10/2020   HCT 43.8 08/10/2020   MCV 87 08/10/2020   PLT  260 08/10/2020   Lab Results  Component Value Date   NA 144 08/10/2020   K 4.0 08/10/2020   CO2 25 08/10/2020   GLUCOSE 90 08/10/2020   BUN 15 08/10/2020   CREATININE 0.88 08/10/2020   BILITOT 0.2 08/10/2020   ALKPHOS 74 08/10/2020  AST 30 08/10/2020   ALT 26 08/10/2020   PROT 7.2 08/10/2020   ALBUMIN 4.6 08/10/2020   CALCIUM 10.1 08/10/2020   Lab Results  Component Value Date   CHOL 300 (H) 08/10/2020   Lab Results  Component Value Date   HDL 65 08/10/2020   Lab Results  Component Value Date   LDLCALC 221 (H) 08/10/2020   Lab Results  Component Value Date   TRIG 84 08/10/2020   Lab Results  Component Value Date   CHOLHDL 4.6 (H) 08/10/2020        Assessment & Plan:   1. Acute non-recurrent maxillary sinusitis - POC COVID-19 BinaxNow - cefTRIAXone (ROCEPHIN) injection 1 g - azithromycin (ZITHROMAX) 250 MG tablet; Take two tablets by mouth on day one, take one tablet by mouth on days two-five  Dispense: 6 tablet; Refill: 0  2. Uncontrolled hypertension - cloNIDine (CATAPRES) tablet 0.1 mg - chlorthalidone (HYGROTON) 25 MG tablet; Take 1 tablet (25 mg total) by mouth daily.  Dispense: 90 tablet; Refill: 0  3. Excessive cerumen in ear canal, bilateral - EAR CERUMEN REMOVAL   Stop Augmentin Begin Zpak as directed Continue Flonase and Claritin daily Begin Chlorthalidone 25 mg daily for hypertension Monitor BP at home Keep scheduled follow-up appointment with Dr Sedalia Muta 11/17/20 Notify office if no improvement  Follow-up:On 11/17/20 or sooner if needed  Signed,  Janie Morning, NP

## 2020-11-07 ENCOUNTER — Other Ambulatory Visit: Payer: Self-pay

## 2020-11-07 ENCOUNTER — Encounter: Payer: Self-pay | Admitting: Family Medicine

## 2020-11-07 ENCOUNTER — Ambulatory Visit: Payer: BC Managed Care – PPO | Admitting: Family Medicine

## 2020-11-07 VITALS — BP 138/78 | HR 80 | Temp 95.5°F | Resp 16 | Ht 63.0 in | Wt 194.0 lb

## 2020-11-07 DIAGNOSIS — K219 Gastro-esophageal reflux disease without esophagitis: Secondary | ICD-10-CM | POA: Diagnosis not present

## 2020-11-07 DIAGNOSIS — I10 Essential (primary) hypertension: Secondary | ICD-10-CM

## 2020-11-07 MED ORDER — OMEPRAZOLE 40 MG PO CPDR: 40.0000 mg | DELAYED_RELEASE_CAPSULE | Freq: Every day | ORAL | 0 refills | Status: DC

## 2020-11-07 MED ORDER — AMLODIPINE BESYLATE 5 MG PO TABS: 5.0000 mg | ORAL_TABLET | Freq: Every day | ORAL | 0 refills | Status: DC

## 2020-11-07 NOTE — Addendum Note (Signed)
Addended by: Janie Morning on: 11/07/2020 07:55 AM   Modules accepted: Level of Service

## 2020-11-07 NOTE — Progress Notes (Signed)
Acute Office Visit  Subjective:    Patient ID: Caroline Kelly, female    DOB: 06-Oct-1960, 60 y.o.   MRN: [id number]  Chief Complaint  Patient presents with  . Hypertension    HPI Patient is in today for uncontrolled hypertension.  Currently on lisinopril 40 mg one twice a day.  Started on chlorthalidone 25 mg once daily.  Bp improves initially after taking medicine, but the returns to 150s/60s.  Pt has had sob with sleep and has been sleeping in recliner. Pt has DOE.  Pt felt this was likely due to GERD.   Past Medical History:  Diagnosis Date  . Allergy   . GERD (gastroesophageal reflux disease)   . Hyperlipidemia   . Hypertension     Past Surgical History:  Procedure Laterality Date  . CESAREAN SECTION    . ENDOMETRIAL ABLATION  1989    Family History  Problem Relation Age of Onset  . Heart failure Mother   . Dementia Mother   . Hyperlipidemia Father   . Hypertension Father   . CAD Father   . Hypertension Sister   . Hypertension Brother     Social History   Socioeconomic History  . Marital status: Married    Spouse name: Not on file  . Number of children: Not on file  . Years of education: Not on file  . Highest education level: Not on file  Occupational History  . Not on file  Tobacco Use  . Smoking status: Former Smoker    Types: Cigarettes    Quit date: 09/2017    Years since quitting: 3.2  . Smokeless tobacco: Never Used  Substance and Sexual Activity  . Alcohol use: Yes    Comment: twice monthly  . Drug use: Never  . Sexual activity: Not on file  Other Topics Concern  . Not on file  Social History Narrative  . Not on file   Social Determinants of Health   Financial Resource Strain: Not on file  Food Insecurity: Not on file  Transportation Needs: Not on file  Physical Activity: Not on file  Stress: Not on file  Social Connections: Not on file  Intimate Partner Violence: Not on file    Outpatient Medications Prior to Visit   Medication Sig Dispense Refill  . albuterol (VENTOLIN HFA) 108 (90 Base) MCG/ACT inhaler Inhale 2 puffs into the lungs every 6 (six) hours as needed for wheezing or shortness of breath. 8 g 0  . Ascorbic Acid (VITAMIN C PO) Take by mouth.    Marland Kitchen atorvastatin (LIPITOR) 40 MG tablet Take 1 tablet (40 mg total) by mouth daily. 90 tablet 0  . chlorthalidone (HYGROTON) 25 MG tablet Take 1 tablet (25 mg total) by mouth daily. 90 tablet 0  . fluticasone (FLONASE) 50 MCG/ACT nasal spray Place 2 sprays into both nostrils daily. 16 g 6  . levothyroxine (SYNTHROID) 25 MCG tablet TAKE ONE TABLET BY MOUTH ONCE DAILY 90 tablet 3  . lisinopril (ZESTRIL) 40 MG tablet Take 1 tablet (40 mg total) by mouth 2 (two) times daily. 180 tablet 0  . loratadine (CLARITIN) 10 MG tablet Take 1 tablet (10 mg total) by mouth daily. 90 tablet 1  . Probiotic Product (PROBIOTIC-10 PO) Take by mouth.    Marland Kitchen VITAMIN D PO Take by mouth.    Marland Kitchen amoxicillin-clavulanate (AUGMENTIN) 875-125 MG tablet Take 1 tablet by mouth 2 (two) times daily for 10 days. 20 tablet 0  . azithromycin (ZITHROMAX) 250  MG tablet Take two tablets by mouth on day one, take one tablet by mouth on days two-five 6 tablet 0   No facility-administered medications prior to visit.    Allergies  Allergen Reactions  . Levofloxacin Hives    Review of Systems  Constitutional: Positive for fatigue. Negative for chills and fever.  HENT: Negative for congestion, ear pain, sinus pain and sore throat.   Respiratory: Positive for shortness of breath.   Cardiovascular: Positive for chest pain. Negative for leg swelling.  Gastrointestinal: Positive for nausea. Negative for constipation, diarrhea and vomiting.  Neurological: Positive for headaches.       Objective:    Physical Exam Vitals reviewed.  Constitutional:      Appearance: Normal appearance.  Cardiovascular:     Rate and Rhythm: Normal rate and regular rhythm.     Heart sounds: Normal heart sounds.   Pulmonary:     Effort: Pulmonary effort is normal.     Breath sounds: Normal breath sounds.  Abdominal:     Tenderness: There is abdominal tenderness (mild suprapubic.).  Neurological:     Mental Status: She is alert.  Psychiatric:        Mood and Affect: Mood normal.        Behavior: Behavior normal.     BP 138/78   Pulse 80   Temp (!) 95.5 F (35.3 C)   Resp 16   Ht 5\' 3"  (1.6 m)   Wt 194 lb (88 kg)   BMI 34.37 kg/m  Wt Readings from Last 3 Encounters:  11/17/20 195 lb (88.5 kg)  11/07/20 194 lb (88 kg)  11/04/20 190 lb (86.2 kg)    Health Maintenance Due  Topic Date Due  . Hepatitis C Screening  Never done  . HIV Screening  Never done  . PAP SMEAR-Modifier  Never done  . COLONOSCOPY (Pts 45-59yrs Insurance coverage will need to be confirmed)  Never done  . TETANUS/TDAP  09/03/2020    There are no preventive care reminders to display for this patient.   Lab Results  Component Value Date   TSH 2.200 08/10/2020   Lab Results  Component Value Date   WBC 5.2 11/17/2020   HGB 15.0 11/17/2020   HCT 44.1 11/17/2020   MCV 87 11/17/2020   PLT 303 11/17/2020   Lab Results  Component Value Date   NA 140 11/17/2020   K 4.4 11/17/2020   CO2 31 (H) 11/17/2020   GLUCOSE 106 (H) 11/17/2020   BUN 17 11/17/2020   CREATININE 0.85 11/17/2020   BILITOT 0.3 11/17/2020   ALKPHOS 89 11/17/2020   AST 32 11/17/2020   ALT 30 11/17/2020   PROT 7.0 11/17/2020   ALBUMIN 4.8 11/17/2020   CALCIUM 10.5 (H) 11/17/2020   Lab Results  Component Value Date   CHOL 300 (H) 08/10/2020   Lab Results  Component Value Date   HDL 65 08/10/2020   Lab Results  Component Value Date   LDLCALC 221 (H) 08/10/2020   Lab Results  Component Value Date   TRIG 84 08/10/2020   Lab Results  Component Value Date   CHOLHDL 4.6 (H) 08/10/2020   No results found for: HGBA1C     Assessment & Plan:  1. Uncontrolled hypertension Start on amlodipine 5 mg one daily.  Continue  chlorthalidone 25 mg once daily.  Continue lisinopril 40 mg one twice a day.   2. GERD without esophagitis  Start on omeprazole 40 mg once daily at night for  acid reflux  Meds ordered this encounter  Medications  . amLODipine (NORVASC) 5 MG tablet    Sig: Take 1 tablet (5 mg total) by mouth daily.    Dispense:  30 tablet    Refill:  0  . omeprazole (PRILOSEC) 40 MG capsule    Sig: Take 1 capsule (40 mg total) by mouth daily.    Dispense:  30 capsule    Refill:  0    Follow-up: Return in about 1 week (around 11/14/2020).  An After Visit Summary was printed and given to the patient.  Blane Ohara, MD [facility] [phone]

## 2020-11-07 NOTE — Patient Instructions (Addendum)
Hypertension: Start on amlodipine 5 mg one daily.  Continue chlorthalidone 25 mg once daily.  Continue lisinopril 40 mg one twice a day.   GERD: Start on omeprazole 40 mg once daily at night for acid reflux.

## 2020-11-08 ENCOUNTER — Telehealth: Payer: Self-pay

## 2020-11-08 NOTE — Telephone Encounter (Signed)
Caroline Kelly called with concerns about her bp. She reports that her bp seems to be elevated with any activity.  Her bp earlier was 144/77, pulse 70. Her bp was rechecked later at 118/94, pulse 70. She is taking the medication as prescribed.  Dr. Sedalia Muta advised that she continue the medication and follow-up in 1 week.  We should continue to see improvement as the medication is allowed to get into her system.

## 2020-11-10 ENCOUNTER — Ambulatory Visit: Payer: BC Managed Care – PPO | Admitting: Family Medicine

## 2020-11-15 NOTE — Progress Notes (Signed)
Subjective:  Patient ID: [name], female    DOB: May 20, 1961  Age: 60 y.o. MRN: [id number]  Chief Complaint  Patient presents with  . Hyperlipidemia  . Hypothyroidism    HPI Essential hypertension, benign: Pt ha been charting her bps. Yesterday she walked to her garden and was carrying a tray of plants (not heavy.) Was doing some gardening (nonstrenuous level.) Pt feels chest pain, headache, diaphoresis, and short of breath. Gets nauseated some. Feels a little light headed now. Episodic and calms with sitting. Unclear how long it lasts. It feels like everything is tight and can radiated into her thoracic back.c 153/67, 102 ---->117/65, 87.  She feels like this all started when she had covid 19 in January 2021 and had a second covid 19 infection in January 2022.  Controlled with norvasc 5 mg daily, lisinopril 40 mg one twice a day and chlorthalidone 25 mg once daily.  She reports sleeping in recliner due to nasal congestion, gerd. Not sob/pnd.  Pt has had a headache for a while and she has assumed it was sinus headaches. Completed antibiotic for strep given 2 weeks ago. Taking claritin and flonase as needed.   Mixed hyperlipidemia Taking lipitor 40 mg once daily. Began a few weeks ago.   Acquired hypothyroidism Takes levothyroxine 25 mcg once daily  Current Outpatient Medications on File Prior to Visit  Medication Sig Dispense Refill  . Coenzyme Q10 (CO Q 10 PO) Take by mouth.    Marland Kitchen albuterol (VENTOLIN HFA) 108 (90 Base) MCG/ACT inhaler Inhale 2 puffs into the lungs every 6 (six) hours as needed for wheezing or shortness of breath. 8 g 0  . amLODipine (NORVASC) 5 MG tablet Take 1 tablet (5 mg total) by mouth daily. 30 tablet 0  . Ascorbic Acid (VITAMIN C PO) Take by mouth.    Marland Kitchen atorvastatin (LIPITOR) 40 MG tablet Take 1 tablet (40 mg total) by mouth daily. 90 tablet 0  . chlorthalidone (HYGROTON) 25 MG tablet Take 1 tablet (25 mg total) by mouth daily. 90 tablet 0  . fluticasone  (FLONASE) 50 MCG/ACT nasal spray Place 2 sprays into both nostrils daily. 16 g 6  . levothyroxine (SYNTHROID) 25 MCG tablet TAKE ONE TABLET BY MOUTH ONCE DAILY 90 tablet 3  . lisinopril (ZESTRIL) 40 MG tablet Take 1 tablet (40 mg total) by mouth 2 (two) times daily. 180 tablet 0  . loratadine (CLARITIN) 10 MG tablet Take 1 tablet (10 mg total) by mouth daily. 90 tablet 1  . omeprazole (PRILOSEC) 40 MG capsule Take 1 capsule (40 mg total) by mouth daily. 30 capsule 0  . Probiotic Product (PROBIOTIC-10 PO) Take by mouth.    Marland Kitchen VITAMIN D PO Take by mouth.     No current facility-administered medications on file prior to visit.   Past Medical History:  Diagnosis Date  . Allergy   . GERD (gastroesophageal reflux disease)   . Hyperlipidemia   . Hypertension    Past Surgical History:  Procedure Laterality Date  . CESAREAN SECTION    . ENDOMETRIAL ABLATION  1989    Family History  Problem Relation Age of Onset  . Heart failure Mother   . Dementia Mother   . Hyperlipidemia Father   . Hypertension Father   . CAD Father   . Hypertension Sister   . Hypertension Brother    Social History   Socioeconomic History  . Marital status: Married    Spouse name: Not on file  .  Number of children: Not on file  . Years of education: Not on file  . Highest education level: Not on file  Occupational History  . Not on file  Tobacco Use  . Smoking status: Former Smoker    Types: Cigarettes    Quit date: 09/2017    Years since quitting: 3.2  . Smokeless tobacco: Never Used  Substance and Sexual Activity  . Alcohol use: Yes    Comment: twice monthly  . Drug use: Never  . Sexual activity: Not on file  Other Topics Concern  . Not on file  Social History Narrative  . Not on file   Social Determinants of Health   Financial Resource Strain: Not on file  Food Insecurity: Not on file  Transportation Needs: Not on file  Physical Activity: Not on file  Stress: Not on file  Social  Connections: Not on file    Review of Systems  Constitutional: Positive for fatigue. Negative for chills and fever.  HENT: Positive for congestion. Negative for ear pain, rhinorrhea and sore throat.   Respiratory: Positive for shortness of breath (with exertion). Negative for cough.   Cardiovascular: Positive for chest pain (with exertion.).  Gastrointestinal: Negative for abdominal pain, constipation, diarrhea, nausea and vomiting.  Genitourinary: Negative for dysuria and urgency.  Musculoskeletal: Negative for myalgias.  Neurological: Positive for headaches (When blood pressure is up.). Negative for dizziness, weakness and light-headedness.  Psychiatric/Behavioral: Negative for dysphoric mood. The patient is not nervous/anxious.      Objective:  BP 128/72   Pulse 83   Temp (!) 93.9 F (34.4 C)   Ht 5\' 3"  (1.6 m)   Wt 195 lb (88.5 kg)   SpO2 99%   BMI 34.54 kg/m   BP/Weight 11/17/2020 11/07/2020 11/04/2020  Systolic BP 128 138 158  Diastolic BP 72 78 92  Wt. (Lbs) 195 194 190  BMI 34.54 34.37 33.66    Physical Exam Vitals reviewed.  Constitutional:      Appearance: Normal appearance. She is normal weight.  Neck:     Vascular: No carotid bruit.  Cardiovascular:     Rate and Rhythm: Normal rate and regular rhythm.     Pulses: Normal pulses.     Heart sounds: Normal heart sounds.  Pulmonary:     Effort: Pulmonary effort is normal. No respiratory distress.     Breath sounds: Normal breath sounds.  Abdominal:     General: Abdomen is flat. Bowel sounds are normal.     Palpations: Abdomen is soft.     Tenderness: There is no abdominal tenderness.  Neurological:     Mental Status: She is alert and oriented to person, place, and time.  Psychiatric:        Mood and Affect: Mood normal.        Behavior: Behavior normal.     Diabetic Foot Exam - Simple   No data filed      Lab Results  Component Value Date   WBC 5.2 11/17/2020   HGB 15.0 11/17/2020   HCT 44.1  11/17/2020   PLT 303 11/17/2020   GLUCOSE 106 (H) 11/17/2020   CHOL 213 (H) 11/17/2020   TRIG 104 11/17/2020   HDL 51 11/17/2020   LDLCALC 143 (H) 11/17/2020   ALT 30 11/17/2020   AST 32 11/17/2020   NA 140 11/17/2020   K 4.4 11/17/2020   CL 99 11/17/2020   CREATININE 0.85 11/17/2020   BUN 17 11/17/2020   CO2 31 (H) 11/17/2020  TSH 2.200 08/10/2020      Assessment & Plan:   1. Essential hypertension, benign Angina/Hypertension: Start on metoprolol xl 25 mg once daily in am. Continue other bp medicines.  Start aspirin 81 mg once daily . Nitroglycerin 0.4 mg put below the tongue every 5 minutes x 3 as needed for chest pain. If need to take one and chest pain does not resolve, call 911 and go to ED.  Stat labs done today. If heart protein (troponin) is up, we will call you and send you to the ED.  If labs is normal, will refer to cardiology.  No exercise until cleared by cardiology - Comprehensive metabolic panel  2. Mixed hyperlipidemia Improved. Continue lipitor 40 mg once daily as she has only been taking it for one month. Low fat diet.  - CBC with Differential/Platelet - Lipid panel  3. Acquired hypothyroidism The current medical regimen is effective;  continue present plan and medications.  4. Dyspnea on exertion - DG Chest 2 View  5. Angina pectoris (HCC) See above.  - EKG 12-Lead - Troponin T    Meds ordered this encounter  Medications  . metoprolol succinate (TOPROL-XL) 25 MG 24 hr tablet    Sig: Take 1 tablet (25 mg total) by mouth daily.    Dispense:  90 tablet    Refill:  3  . aspirin EC 81 MG tablet    Sig: Take 1 tablet (81 mg total) by mouth daily. Swallow whole.    Dispense:  30 tablet    Refill:  11  . nitroGLYCERIN (NITROSTAT) 0.4 MG SL tablet    Sig: Place 1 tablet (0.4 mg total) under the tongue every 5 (five) minutes as needed for chest pain.    Dispense:  25 tablet    Refill:  3    Orders Placed This Encounter  Procedures  . DG  Chest 2 View  . Varicella-zoster vaccine IM (Shingrix)  . Comprehensive metabolic panel  . CBC with Differential/Platelet  . Troponin T  . Lipid panel  . Cardiovascular Risk Assessment  . EKG 12-Lead    Follow-up: Return in about 3 months (around 02/17/2021) for fasting..   Needs sooner follow up if delayed getting in to cardiology, but I would prefer she be seen next week.   An After Visit Summary was printed and given to the patient.  Blane Ohara, MD [facility] [phone]

## 2020-11-17 ENCOUNTER — Other Ambulatory Visit: Payer: Self-pay

## 2020-11-17 ENCOUNTER — Encounter: Payer: Self-pay | Admitting: Family Medicine

## 2020-11-17 ENCOUNTER — Ambulatory Visit: Payer: BC Managed Care – PPO | Admitting: Family Medicine

## 2020-11-17 VITALS — BP 128/72 | HR 83 | Temp 93.9°F | Ht 63.0 in | Wt 195.0 lb

## 2020-11-17 DIAGNOSIS — Z23 Encounter for immunization: Secondary | ICD-10-CM | POA: Diagnosis not present

## 2020-11-17 DIAGNOSIS — I209 Angina pectoris, unspecified: Secondary | ICD-10-CM

## 2020-11-17 DIAGNOSIS — R0609 Other forms of dyspnea: Secondary | ICD-10-CM

## 2020-11-17 DIAGNOSIS — I10 Essential (primary) hypertension: Secondary | ICD-10-CM | POA: Diagnosis not present

## 2020-11-17 DIAGNOSIS — R079 Chest pain, unspecified: Secondary | ICD-10-CM | POA: Diagnosis not present

## 2020-11-17 DIAGNOSIS — E039 Hypothyroidism, unspecified: Secondary | ICD-10-CM | POA: Diagnosis not present

## 2020-11-17 DIAGNOSIS — E782 Mixed hyperlipidemia: Secondary | ICD-10-CM

## 2020-11-17 DIAGNOSIS — R0600 Dyspnea, unspecified: Secondary | ICD-10-CM | POA: Diagnosis not present

## 2020-11-17 DIAGNOSIS — R0602 Shortness of breath: Secondary | ICD-10-CM | POA: Diagnosis not present

## 2020-11-17 LAB — CBC WITH DIFFERENTIAL/PLATELET
Basophils Absolute: 0.1 10*3/uL (ref 0.0–0.2)
Basos: 1 %
EOS (ABSOLUTE): 0.1 10*3/uL (ref 0.0–0.4)
Eos: 2 %
Hematocrit: 44.1 % (ref 34.0–46.6)
Hemoglobin: 15 g/dL (ref 11.1–15.9)
Lymphocytes Absolute: 2 10*3/uL (ref 0.7–3.1)
Lymphs: 39 %
MCH: 29.6 pg (ref 26.6–33.0)
MCHC: 34 g/dL (ref 31.5–35.7)
MCV: 87 fL (ref 79–97)
Monocytes Absolute: 0.5 10*3/uL (ref 0.1–0.9)
Monocytes: 10 %
Neutrophils Absolute: 2.5 10*3/uL (ref 1.4–7.0)
Neutrophils: 48 %
Platelets: 303 10*3/uL (ref 150–450)
RBC: 5.06 x10E6/uL (ref 3.77–5.28)
RDW: 14.3 % (ref 11.7–15.4)
WBC: 5.2 10*3/uL (ref 3.4–10.8)

## 2020-11-17 LAB — COMPREHENSIVE METABOLIC PANEL
ALT: 30 IU/L (ref 0–32)
AST: 32 IU/L (ref 0–40)
Albumin/Globulin Ratio: 2.2 (ref 1.2–2.2)
Albumin: 4.8 g/dL (ref 3.8–4.9)
Alkaline Phosphatase: 89 IU/L (ref 44–121)
BUN/Creatinine Ratio: 20 (ref 9–23)
BUN: 17 mg/dL (ref 6–24)
Bilirubin Total: 0.3 mg/dL (ref 0.0–1.2)
CO2: 31 mmol/L — ABNORMAL HIGH (ref 20–29)
Calcium: 10.5 mg/dL — ABNORMAL HIGH (ref 8.7–10.2)
Chloride: 99 mmol/L (ref 96–106)
Creatinine, Ser: 0.85 mg/dL (ref 0.57–1.00)
Globulin, Total: 2.2 g/dL (ref 1.5–4.5)
Glucose: 106 mg/dL — ABNORMAL HIGH (ref 65–99)
Potassium: 4.4 mmol/L (ref 3.5–5.2)
Sodium: 140 mmol/L (ref 134–144)
Total Protein: 7 g/dL (ref 6.0–8.5)
eGFR: 79 mL/min/{1.73_m2} (ref >59)

## 2020-11-17 LAB — TROPONIN T: Troponin T (Highly Sensitive): <6 ng/L (ref 0–14)

## 2020-11-17 MED ORDER — ASPIRIN EC 81 MG PO TBEC: 81.0000 mg | DELAYED_RELEASE_TABLET | Freq: Every day | ORAL | 11 refills | Status: DC

## 2020-11-17 MED ORDER — NITROGLYCERIN 0.4 MG SL SUBL: 0.4000 mg | SUBLINGUAL_TABLET | SUBLINGUAL | 3 refills | Status: DC | PRN

## 2020-11-17 MED ORDER — METOPROLOL SUCCINATE ER 25 MG PO TB24: 25.0000 mg | ORAL_TABLET | Freq: Every day | ORAL | 3 refills | Status: DC

## 2020-11-17 NOTE — Patient Instructions (Addendum)
Angina/Hypertension: Start on metoprolol xl 25 mg once daily in am. Continue other bp medicines.  Start aspirin 81 mg once daily . Nitroglycerin 0.4 mg put below the tongue every 5 minutes x 3 as needed for chest pain. If need to take one and chest pain does not resolve, call 911 and go to ED.  Stat labs done today. If heart protein (troponin) is up, we will call you and send you to the ED.  If labs is normal, will refer to cardiology.    Angina  Angina is discomfort or pain in the chest, neck, arm, jaw, or back. The discomfort is caused by a lack of blood in the middle layer of the heart wall. The middle layer of the heart wall is called the myocardium. What are the causes? This condition is caused by a buildup of fat and cholesterol, or plaque, in your arteries. This buildup narrows the arteries and makes it hard for blood to flow. What increases the risk? Main risks  High levels of cholesterol in your blood.  High blood pressure.  Diabetes.  Family history of heart disease.  Not exercising or moving enough.  Depression.  Having had radiation treatment on the left side of your chest. Other risks  Tobacco use.  Too much body weight (obesity).  A diet that is high in unhealthy fats (saturated fats).  Stress.  Using drugs, such as cocaine. Risks for women  Being older than 55 years.  Being in menopause. This is the time when a woman no longer has a menstrual period. What are the signs or symptoms? Symptoms in all people  Chest pain, which may: ? Feel like a crushing or squeezing in the chest. ? Feel like a tightness, pressure, fullness, or heaviness in the chest. ? Last for more than a few minutes at a time. ? Stop and come back (recur) after a few minutes.  Pain in the neck, arm, jaw, or back.  Heartburn or upset stomach (indigestion) for no reason.  Being short of breath.  Feeling like you may vomit (nauseous).  Sudden cold sweats. Symptoms in women and  people with diabetes  Tiredness.  Worry and anxiety.  Weakness.  Dizziness or fainting. How is this treated? This condition may be treated with:  Medicines. These can be given to prevent blood clots, stop a heart attack, lower blood pressure, or treat other risk factors.  A procedure to widen a narrowed or blocked artery in the heart.  Surgery to allow blood to go around a blocked artery. Follow these instructions at home: Medicines  Take over-the-counter and prescription medicines only as told by your doctor.  Do not take these medicines unless your doctor says that you can: ? NSAIDs. These include:  Ibuprofen.  Naproxen. ? Vitamin supplements that have vitamin A, vitamin E, or both. ? Hormone therapy that contains estrogen with or without progestin. Eating and drinking  Eat a heart-healthy diet that includes: ? Lots of fresh fruits and vegetables. ? Whole grains. ? Low-fat (lean) protein. ? Low-fat dairy products.  Follow instructions from your doctor about what you cannot eat or drink.   Activity  Follow an exercise program that your doctor tells you.  Talk with your doctor about joining a program to make your heart strong again (cardiac rehab).  When you feel tired, take a break. Plan breaks if you know you are going to feel tired. Lifestyle  Do not smoke or use any products that contain nicotine or tobacco.  If you need help quitting, ask your doctor.  If your doctor says you can drink alcohol: ? Limit how much you have to:  0-1 drink a day for women who are not pregnant.  0-2 drinks a day for men. ? Know how much alcohol is in your drink. In the [place] one drink equals one 12 oz bottle of beer (355 mL), one 5 oz glass of wine (148 mL), or one 1 oz glass of hard liquor (44 mL).   General instructions  Stay at a healthy weight. If told to lose weight, work with your doctor to do lose weight safely.  Learn to manage stress. If you need help, ask your  doctor.  Keep your vaccines up to date. Get a flu shot every year.  Talk with your doctor if you feel sad. Take a screening test to see if you are at risk for depression.  Work with your doctor to manage any other health problems that you have. These may include diabetes or high blood pressure.  Keep all follow-up visits. Get help right away if:  You have pain in your chest, neck, arm, jaw, or back, and the pain: ? Lasts more than a few minutes. ? Comes back. ? Does not get better after you take medicine under your tongue (sublingual nitroglycerin). ? Keeps getting worse. ? Comes more often.  You have any of these problems: ? Sweating a lot. ? Heartburn or upset stomach. ? Shortness of breath. ? Trouble breathing. ? Feeling like you may vomit. ? Vomiting. ? Feeling more tired than normal. ? Feeling nervous or worrying more than normal. ? Weakness.  You are dizzy or light-headed all of a sudden.  You faint. These symptoms may be an emergency. Get help right away. Call your local emergency services (911 in the [place]  Do not wait to see if the symptoms will go away.  Do not drive yourself to the hospital. Summary  Angina is discomfort or pain in the chest, neck, arm, neck, or back.  Symptoms include chest pain, heartburn or upset stomach, and shortness of breath.  Women or people with diabetes may have other symptoms, such as feeling nervous, being worried, or being weak or tired.  Take all medicines only as told by your doctor.  You should eat a heart-healthy diet and follow an exercise program. This information is not intended to replace advice given to you by your health care provider. Make sure you discuss any questions you have with your health care provider. Document Revised: 02/12/2020 Document Reviewed: 02/12/2020 Elsevier Patient Education  2021 Elsevier Inc.  Nitroglycerin sublingual tablets What is this medicine? NITROGLYCERIN (nye troe GLI ser in) is a  type of vasodilator. It relaxes blood vessels, increasing the blood and oxygen supply to your heart. This medicine is used to relieve chest pain caused by angina. It is also used to prevent chest pain before activities like climbing stairs, going outdoors in cold weather, or sexual activity. This medicine may be used for other purposes; ask your health care provider or pharmacist if you have questions. COMMON BRAND NAME(S): Nitroquick, Nitrostat, Nitrotab What should I tell my health care provider before I take this medicine? They need to know if you have any of these conditions:  anemia  head injury, recent stroke, or bleeding in the brain  liver disease  previous heart attack  an unusual or allergic reaction to nitroglycerin, other medicines, foods, dyes, or preservatives  pregnant or trying to get pregnant  breast-feeding How should I use this medicine? Take this medicine by mouth as needed. Use at the first sign of an angina attack (chest pain or tightness). You can also take this medicine 5 to 10 minutes before an event likely to produce chest pain. Follow the directions exactly as written on the prescription label. Place one tablet under your tongue and let it dissolve. Do not swallow whole. Replace the dose if you accidentally swallow it. It will help if your mouth is not dry. Saliva around the tablet will help it to dissolve more quickly. Do not eat or drink, smoke or chew tobacco while a tablet is dissolving. Sit down when taking this medicine. In an angina attack, you should feel better within 5 minutes after your first dose. You can take a dose every 5 minutes up to a total of 3 doses. If you do not feel better or feel worse after 1 dose, call 9-1-1 at once. Do not take more than 3 doses in 15 minutes. Your health care provider might give you other directions. Follow those directions if he or she does. Do not take your medicine more often than directed. Talk to your health care  provider about the use of this medicine in children. Special care may be needed. Overdosage: If you think you have taken too much of this medicine contact a poison control center or emergency room at once. NOTE: This medicine is only for you. Do not share this medicine with others. What if I miss a dose? This does not apply. This medicine is only used as needed. What may interact with this medicine? Do not take this medicine with any of the following medications:  certain migraine medicines like ergotamine and dihydroergotamine (DHE)  medicines used to treat erectile dysfunction like sildenafil, tadalafil, and vardenafil  riociguat This medicine may also interact with the following medications:  alteplase  aspirin  heparin  medicines for high blood pressure  medicines for mental depression  other medicines used to treat angina  phenothiazines like chlorpromazine, mesoridazine, prochlorperazine, thioridazine This list may not describe all possible interactions. Give your health care provider a list of all the medicines, herbs, non-prescription drugs, or dietary supplements you use. Also tell them if you smoke, drink alcohol, or use illegal drugs. Some items may interact with your medicine. What should I watch for while using this medicine? Tell your doctor or health care professional if you feel your medicine is no longer working. Keep this medicine with you at all times. Sit or lie down when you take your medicine to prevent falling if you feel dizzy or faint after using it. Try to remain calm. This will help you to feel better faster. If you feel dizzy, take several deep breaths and lie down with your feet propped up, or bend forward with your head resting between your knees. You may get drowsy or dizzy. Do not drive, use machinery, or do anything that needs mental alertness until you know how this drug affects you. Do not stand or sit up quickly, especially if you are an older  patient. This reduces the risk of dizzy or fainting spells. Alcohol can make you more drowsy and dizzy. Avoid alcoholic drinks. Do not treat yourself for coughs, colds, or pain while you are taking this medicine without asking your doctor or health care professional for advice. Some ingredients may increase your blood pressure. What side effects may I notice from receiving this medicine? Side effects that you should report to  your doctor or health care professional as soon as possible:  allergic reactions (skin rash, itching or hives; swelling of the face, lips, or tongue)  low blood pressure (dizziness; feeling faint or lightheaded, falls; unusually weak or tired)  low red blood cell counts (trouble breathing; feeling faint; lightheaded, falls; unusually weak or tired) Side effects that usually do not require medical attention (report to your doctor or health care professional if they continue or are bothersome):  facial flushing (redness)  headache  nausea, vomiting This list may not describe all possible side effects. Call your doctor for medical advice about side effects. You may report side effects to FDA at 1-800-FDA-1088. Where should I keep my medicine? Keep out of the reach of children. Store at room temperature between 20 and 25 degrees C (68 and 77 degrees F). Store in Retail buyer. Protect from light and moisture. Keep tightly closed. Throw away any unused medicine after the expiration date. NOTE: This sheet is a summary. It may not cover all possible information. If you have questions about this medicine, talk to your doctor, pharmacist, or health care provider.  2021 Elsevier/Gold Standard (2018-05-21 16:46:32)

## 2020-11-18 ENCOUNTER — Encounter: Payer: Self-pay | Admitting: Family Medicine

## 2020-11-18 ENCOUNTER — Other Ambulatory Visit: Payer: Self-pay | Admitting: Family Medicine

## 2020-11-18 ENCOUNTER — Telehealth: Payer: Self-pay

## 2020-11-18 DIAGNOSIS — I10 Essential (primary) hypertension: Secondary | ICD-10-CM

## 2020-11-18 DIAGNOSIS — R0609 Other forms of dyspnea: Secondary | ICD-10-CM

## 2020-11-18 DIAGNOSIS — E782 Mixed hyperlipidemia: Secondary | ICD-10-CM

## 2020-11-18 DIAGNOSIS — R0600 Dyspnea, unspecified: Secondary | ICD-10-CM

## 2020-11-18 DIAGNOSIS — I209 Angina pectoris, unspecified: Secondary | ICD-10-CM

## 2020-11-18 LAB — LIPID PANEL
Chol/HDL Ratio: 4.2 ratio (ref 0.0–4.4)
Cholesterol, Total: 213 mg/dL — ABNORMAL HIGH (ref 100–199)
HDL: 51 mg/dL (ref >39)
LDL Chol Calc (NIH): 143 mg/dL — ABNORMAL HIGH (ref 0–99)
Triglycerides: 104 mg/dL (ref 0–149)
VLDL Cholesterol Cal: 19 mg/dL (ref 5–40)

## 2020-11-18 LAB — CARDIOVASCULAR RISK ASSESSMENT

## 2020-11-18 NOTE — Telephone Encounter (Signed)
I discussed with patient  Bp at noon was 128/59, P74.  Change to bp medicines as follows Continue lisinopril 40 mg one twice a day.  Continue Toprol xl 25 mg once daily (she only took metoprolo xl 25 mg 1/2 daily this am. May take her other 1/2 pill this evening.  Hold chlorthalidone and amlodipine.  Goal is to not go below sbp 100.

## 2020-11-18 NOTE — Telephone Encounter (Signed)
Pt called back. Pt was also give chlorthalidone and amlodipine. She is questioning what to do with these.   Also states heart center gave her an appointment but it is in three weeks. Told pt we will call and get the referral and appointment corrected if possible.   Lorita Officer, CCMA 11/18/20 9:02 AM

## 2020-11-18 NOTE — Telephone Encounter (Signed)
Pt called before taking BP medication this morning due to concern of low BP. BP was 113/44. Denies chest pain/tightness, changes in heart, dizziness. Pt states she last took lisinopril 40 mg last night at 8 pm.   Speaking with Dr, she stated her BP was great. Would be more concerned if diastolic was high. Dr updated lab results and medication changes. (in lab notes).  Returned call to pt. Pt VU of results and medications. Pt stated the heart center was calling and ended the call.   Caroline Kelly 11/18/20 8:49 AM

## 2020-11-22 ENCOUNTER — Telehealth: Payer: Self-pay

## 2020-11-22 DIAGNOSIS — K219 Gastro-esophageal reflux disease without esophagitis: Secondary | ICD-10-CM | POA: Insufficient documentation

## 2020-11-22 DIAGNOSIS — E785 Hyperlipidemia, unspecified: Secondary | ICD-10-CM | POA: Insufficient documentation

## 2020-11-22 DIAGNOSIS — T7840XA Allergy, unspecified, initial encounter: Secondary | ICD-10-CM | POA: Insufficient documentation

## 2020-11-22 DIAGNOSIS — I10 Essential (primary) hypertension: Secondary | ICD-10-CM | POA: Insufficient documentation

## 2020-11-22 NOTE — Telephone Encounter (Signed)
Patient informed of normal CXR results per Dr. Sedalia Muta.

## 2020-11-23 ENCOUNTER — Encounter: Payer: Self-pay | Admitting: Cardiology

## 2020-11-23 ENCOUNTER — Ambulatory Visit: Payer: BC Managed Care – PPO | Admitting: Cardiology

## 2020-11-23 ENCOUNTER — Other Ambulatory Visit: Payer: Self-pay

## 2020-11-23 VITALS — BP 148/88 | HR 67 | Ht 63.0 in | Wt 200.0 lb

## 2020-11-23 DIAGNOSIS — R9431 Abnormal electrocardiogram [ECG] [EKG]: Secondary | ICD-10-CM

## 2020-11-23 DIAGNOSIS — R0600 Dyspnea, unspecified: Secondary | ICD-10-CM | POA: Diagnosis not present

## 2020-11-23 DIAGNOSIS — E782 Mixed hyperlipidemia: Secondary | ICD-10-CM | POA: Diagnosis not present

## 2020-11-23 DIAGNOSIS — R0609 Other forms of dyspnea: Secondary | ICD-10-CM

## 2020-11-23 DIAGNOSIS — R0789 Other chest pain: Secondary | ICD-10-CM

## 2020-11-23 DIAGNOSIS — E669 Obesity, unspecified: Secondary | ICD-10-CM

## 2020-11-23 DIAGNOSIS — R0602 Shortness of breath: Secondary | ICD-10-CM

## 2020-11-23 DIAGNOSIS — I10 Essential (primary) hypertension: Secondary | ICD-10-CM | POA: Diagnosis not present

## 2020-11-23 MED ORDER — CARVEDILOL 12.5 MG PO TABS: 12.5000 mg | ORAL_TABLET | Freq: Two times a day (BID) | ORAL | 3 refills | Status: DC

## 2020-11-23 MED ORDER — METOPROLOL TARTRATE 100 MG PO TABS: ORAL_TABLET | ORAL | 0 refills | Status: DC

## 2020-11-23 NOTE — Patient Instructions (Addendum)
Medication Instructions:  Your physician has recommended you make the following change in your medication:  STOP:  Toprol-XL (Metoprolol Succinate) START: Coreg 12.5 mg twice daily *If you need a refill on your cardiac medications before your next appointment, please call your pharmacy*   Lab Work: Your physician recommends that you return for lab work: 3-7 days before CT: BMET, Mag If you have labs (blood work) drawn today and your tests are completely normal, you will receive your results only by: Marland Kitchen MyChart Message (if you have MyChart) OR . A paper copy in the mail If you have any lab test that is abnormal or we need to change your treatment, we will call you to review the results.   Testing/Procedures: Your physician has requested that you have an echocardiogram. Echocardiography is a painless test that uses sound waves to create images of your heart. It provides your doctor with information about the size and shape of your heart and how well your heart's chambers and valves are working. This procedure takes approximately one hour. There are no restrictions for this procedure.  Your cardiac CT will be scheduled at:   [facility] [address] [place], [place] [address] [phone]   If scheduled at [facility], please arrive at the [facility] main entrance (entrance A) of [facility] 30 minutes prior to test start time. Proceed to the [facility] Radiology Department (first floor) to check-in and test prep.  Please follow these instructions carefully (unless otherwise directed):   On the Night Before the Test: . Be sure to Drink plenty of water. . Do not consume any caffeinated/decaffeinated beverages or chocolate 12 hours prior to your test. . Do not take any antihistamines 12 hours prior to your test.  On the Day of the Test: . Drink plenty of water until 1 hour prior to the test. . Do not eat any food 4 hours prior to the test. . You may  take your regular medications prior to the test. . HOLD Morning dose of Coreg (Carvedilol) . Take metoprolol (Lopressor) two hours prior to test. . FEMALES- please wear underwire-free bra if available       After the Test: . Drink plenty of water. . After receiving IV contrast, you may experience a mild flushed feeling. This is normal. . On occasion, you may experience a mild rash up to 24 hours after the test. This is not dangerous. If this occurs, you can take Benadryl 25 mg and increase your fluid intake. . If you experience trouble breathing, this can be serious. If it is severe call 911 IMMEDIATELY. If it is mild, please call our office. . If you take any of these medications: Glipizide/Metformin, Avandament, Glucavance, please do not take 48 hours after completing test unless otherwise instructed.   Once we have confirmed authorization from your insurance company, we will call you to set up a date and time for your test. Based on how quickly your insurance processes prior authorizations requests, please allow up to 4 weeks to be contacted for scheduling your Cardiac CT appointment. Be advised that routine Cardiac CT appointments could be scheduled as many as 8 weeks after your provider has ordered it.  For non-scheduling related questions, please contact the cardiac imaging nurse navigator should you have any questions/concerns: Rockwell Alexandria, Cardiac Imaging Nurse Navigator Larey Brick, Cardiac Imaging Nurse Navigator [facility] Direct Office Dial: [phone]   For scheduling needs, including cancellations and  rescheduling, please call [place], [phone].     Follow-Up: At [facility], you and your health needs are our priority.  As part of our continuing mission to provide you with exceptional heart care, we have created designated Provider Care Teams.  These Care Teams include your primary Cardiologist (physician) and Advanced Practice  Providers (APPs -  Physician Assistants and Nurse Practitioners) who all work together to provide you with the care you need, when you need it.  We recommend signing up for the patient portal called "MyChart".  Sign up information is provided on this After Visit Summary.  MyChart is used to connect with patients for Virtual Visits (Telemedicine).  Patients are able to view lab/test results, encounter notes, upcoming appointments, etc.  Non-urgent messages can be sent to your provider as well.   To learn more about what you can do with MyChart, go to [email or web address].    Your next appointment:   2 week(s)  The format for your next appointment:   In Person  Provider:   Thomasene Ripple, DO   Other Instructions  Echocardiogram An echocardiogram is a test that uses sound waves (ultrasound) to produce images of the heart. Images from an echocardiogram can provide important information about:  Heart size and shape.  The size and thickness and movement of your heart's walls.  Heart muscle function and strength.  Heart valve function or if you have stenosis. Stenosis is when the heart valves are too narrow.  If blood is flowing backward through the heart valves (regurgitation).  A tumor or infectious growth around the heart valves.  Areas of heart muscle that are not working well because of poor blood flow or injury from a heart attack.  Aneurysm detection. An aneurysm is a weak or damaged part of an artery wall. The wall bulges out from the normal force of blood pumping through the body. Tell a health care provider about:  Any allergies you have.  All medicines you are taking, including vitamins, herbs, eye drops, creams, and over-the-counter medicines.  Any blood disorders you have.  Any surgeries you have had.  Any medical conditions you have.  Whether you are pregnant or may be pregnant. What are the risks? Generally, this is a safe test. However, problems may occur,  including an allergic reaction to dye (contrast) that may be used during the test. What happens before the test? No specific preparation is needed. You may eat and drink normally. What happens during the test?  You will take off your clothes from the waist up and put on a hospital gown.  Electrodes or electrocardiogram (ECG)patches may be placed on your chest. The electrodes or patches are then connected to a device that monitors your heart rate and rhythm.  You will lie down on a table for an ultrasound exam. A gel will be applied to your chest to help sound waves pass through your skin.  A handheld device, called a transducer, will be pressed against your chest and moved over your heart. The transducer produces sound waves that travel to your heart and bounce back (or "echo" back) to the transducer. These sound waves will be captured in real-time and changed into images of your heart that can be viewed on a video monitor. The images will be recorded on a computer and reviewed by your health care provider.  You may be asked to change positions or hold your breath for a short time. This makes it easier to get different views or better  views of your heart.  In some cases, you may receive contrast through an IV in one of your veins. This can improve the quality of the pictures from your heart. The procedure may vary among health care providers and hospitals.   What can I expect after the test? You may return to your normal, everyday life, including diet, activities, and medicines, unless your health care provider tells you not to do that. Follow these instructions at home:  It is up to you to get the results of your test. Ask your health care provider, or the department that is doing the test, when your results will be ready.  Keep all follow-up visits. This is important. Summary  An echocardiogram is a test that uses sound waves (ultrasound) to produce images of the heart.  Images from an  echocardiogram can provide important information about the size and shape of your heart, heart muscle function, heart valve function, and other possible heart problems.  You do not need to do anything to prepare before this test. You may eat and drink normally.  After the echocardiogram is completed, you may return to your normal, everyday life, unless your health care provider tells you not to do that. This information is not intended to replace advice given to you by your health care provider. Make sure you discuss any questions you have with your health care provider. Document Revised: 04/12/2020 Document Reviewed: 04/12/2020 Elsevier Patient Education  2021 [employer].

## 2020-11-23 NOTE — Progress Notes (Signed)
Cardiology Office Note:    Date:  11/23/2020   ID:  Caroline Kelly, DOB 1961-06-11, MRN [id number]  PCP:  Blane Ohara, MD  Cardiologist:  No primary care provider on file.  Electrophysiologist:  None   Referring MD: Blane Ohara, MD   Have been having some chest pain shortness of breath.  History of Present Illness:    Caroline Kelly is a 60 y.o. female with a hx of hypertension, hyperlipidemia, family history of coronary artery disease, obesity is here today to be evaluated for shortness of breath and chest tightness.  Patient tells me over the last several weeks she has been experiencing some chest tightness.  But most recently the shortness of breath is getting worse.  She notes that last week she fell this episode where she went to her garden to work in her garden bed and after pulling out 2 of the plants that she felt significantly tired she had to sit down and ended up going back in the house and was not able to do her gardening.  She also notes recently she is afraid to do a few things given the fact that she gets short of breath so quickly.  Husband is here with her today he tells me that this is not his wife and she is a very active person but recently she is unable to do these activities that she enjoys.  She recently saw her PCP and at which time she was hypertensive she being treated for hypertension at that time she was started on Toprol-XL 25 mg daily.  She had stopped the amlodipine and chlorthalidone because these together made her blood pressure significantly low her PCP has asked her to stop these.    Past Medical History:  Diagnosis Date  . Allergy   . GERD (gastroesophageal reflux disease)   . Hyperlipidemia   . Hypertension     Past Surgical History:  Procedure Laterality Date  . CESAREAN SECTION    . ENDOMETRIAL ABLATION  1989    Current Medications: Current Meds  Medication Sig  . albuterol (VENTOLIN HFA) 108 (90 Base) MCG/ACT inhaler Inhale 2 puffs into  the lungs every 6 (six) hours as needed for wheezing or shortness of breath.  Marland Kitchen amLODipine (NORVASC) 5 MG tablet Take 1 tablet (5 mg total) by mouth daily.  . Ascorbic Acid (VITAMIN C PO) Take by mouth.  Marland Kitchen aspirin EC 81 MG tablet Take 1 tablet (81 mg total) by mouth daily. Swallow whole.  Marland Kitchen atorvastatin (LIPITOR) 40 MG tablet Take 1 tablet (40 mg total) by mouth daily.  . calcium-vitamin D (OSCAL WITH D) 500-200 MG-UNIT tablet Take 1 tablet by mouth daily with breakfast.  . carvedilol (COREG) 12.5 MG tablet Take 1 tablet (12.5 mg total) by mouth 2 (two) times daily.  . chlorthalidone (HYGROTON) 25 MG tablet Take 1 tablet (25 mg total) by mouth daily.  . Coenzyme Q10 (CO Q 10 PO) Take by mouth.  . fluticasone (FLONASE) 50 MCG/ACT nasal spray Place 2 sprays into both nostrils daily.  Marland Kitchen levothyroxine (SYNTHROID) 25 MCG tablet TAKE ONE TABLET BY MOUTH ONCE DAILY  . lisinopril (ZESTRIL) 40 MG tablet TAKE 1 TABLET BY MOUTH TWICE DAILY.  Marland Kitchen loratadine (CLARITIN) 10 MG tablet Take 1 tablet (10 mg total) by mouth daily.  . metoprolol tartrate (LOPRESSOR) 100 MG tablet Take 2 hours prior to CT  . nitroGLYCERIN (NITROSTAT) 0.4 MG SL tablet Place 1 tablet (0.4 mg total) under the tongue every 5 (  five) minutes as needed for chest pain.  Marland Kitchen omeprazole (PRILOSEC) 40 MG capsule Take 1 capsule (40 mg total) by mouth daily.  . Probiotic Product (PROBIOTIC-10 PO) Take by mouth.  Marland Kitchen VITAMIN D PO Take by mouth.  . [DISCONTINUED] metoprolol succinate (TOPROL-XL) 25 MG 24 hr tablet Take 1 tablet (25 mg total) by mouth daily.     Allergies:   Levofloxacin   Social History   Socioeconomic History  . Marital status: Married    Spouse name: Not on file  . Number of children: Not on file  . Years of education: Not on file  . Highest education level: Not on file  Occupational History  . Not on file  Tobacco Use  . Smoking status: Former Smoker    Types: Cigarettes    Quit date: 09/2017    Years since quitting:  3.2  . Smokeless tobacco: Never Used  Substance and Sexual Activity  . Alcohol use: Yes    Comment: twice monthly  . Drug use: Never  . Sexual activity: Not on file  Other Topics Concern  . Not on file  Social History Narrative  . Not on file   Social Determinants of Health   Financial Resource Strain: Not on file  Food Insecurity: Not on file  Transportation Needs: Not on file  Physical Activity: Not on file  Stress: Not on file  Social Connections: Not on file     Family History: The patient's family history includes CAD in her father; Dementia in her mother; Heart failure in her mother; Hyperlipidemia in her father; Hypertension in her brother, father, and sister.  ROS:   Review of Systems  Constitution: Negative for decreased appetite, fever and weight gain.  HENT: Negative for congestion, ear discharge, hoarse voice and sore throat.   Eyes: Negative for discharge, redness, vision loss in right eye and visual halos.  Cardiovascular: Negative for chest pain, dyspnea on exertion, leg swelling, orthopnea and palpitations.  Respiratory: Negative for cough, hemoptysis, shortness of breath and snoring.   Endocrine: Negative for heat intolerance and polyphagia.  Hematologic/Lymphatic: Negative for bleeding problem. Does not bruise/bleed easily.  Skin: Negative for flushing, nail changes, rash and suspicious lesions.  Musculoskeletal: Negative for arthritis, joint pain, muscle cramps, myalgias, neck pain and stiffness.  Gastrointestinal: Negative for abdominal pain, bowel incontinence, diarrhea and excessive appetite.  Genitourinary: Negative for decreased libido, genital sores and incomplete emptying.  Neurological: Negative for brief paralysis, focal weakness, headaches and loss of balance.  Psychiatric/Behavioral: Negative for altered mental status, depression and suicidal ideas.  Allergic/Immunologic: Negative for HIV exposure and persistent infections.    EKGs/Labs/Other  Studies Reviewed:    The following studies were reviewed today:   EKG:  The ekg ordered today demonstrates sinus rhythm, heart rate 67 bpm with poor precordial leads suggesting old septal wall infarction.  Recent Labs: 08/10/2020: TSH 2.200 11/17/2020: ALT 30; BUN 17; Creatinine, Ser 0.85; Hemoglobin 15.0; Platelets 303; Potassium 4.4; Sodium 140  Recent Lipid Panel    Component Value Date/Time   CHOL 213 (H) 11/17/2020 1040   TRIG 104 11/17/2020 1040   HDL 51 11/17/2020 1040   CHOLHDL 4.2 11/17/2020 1040   LDLCALC 143 (H) 11/17/2020 1040    Physical Exam:    VS:  BP (!) 148/88 (BP Location: Left Arm)   Pulse 67   Ht  (1.6 m)   Wt 200 lb (90.7 kg)   SpO2 98%   BMI 35.43 kg/m  Wt Readings from Last 3 Encounters:  11/23/20 200 lb (90.7 kg)  11/17/20 195 lb (88.5 kg)  11/07/20 194 lb (88 kg)     GEN: Well nourished, well developed in no acute distress HEENT: Normal NECK: No JVD; No carotid bruits LYMPHATICS: No lymphadenopathy CARDIAC: S1S2 noted,RRR, no murmurs, rubs, gallops RESPIRATORY:  Clear to auscultation without rales, wheezing or rhonchi  ABDOMEN: Soft, non-tender, non-distended, +bowel sounds, no guarding. EXTREMITIES: No edema, No cyanosis, no clubbing MUSCULOSKELETAL:  No deformity  SKIN: Warm and dry NEUROLOGIC:  Alert and oriented x 3, non-focal PSYCHIATRIC:  Normal affect, good insight  ASSESSMENT:    1. Dyspnea on exertion   2. Other chest pain   3. Hypertension, unspecified type   4. Mixed hyperlipidemia   5. Obesity (BMI 30-39.9)   6. Chest tightness   7. Abnormal EKG   8. Shortness of breath    PLAN:     Her chest pain, dyspnea on exertion and abnormal EKG is concerning patient does have intermediate risk for coronary artery disease would not like to do is pursue an ischemic evaluation in this patient.  Shared decision a coronary CTA at this time is appropriate.  I have discussed with the patient about the testing.  The patient has  no IV contrast allergy and is agreeable to proceed with this test.  She has nitroglycerin which was ordered by her PCP of reminded the patient how to use this.  Also educated her if her symptoms persist to go to the emergency department.  Echocardiogram will also be done to assess LV/RV function and any other structure abnormalities.  She is also hypertensive in the office today.  We will stop Toprol-XL start patient on carvedilol 12.5 mg daily.  Her goal blood pressures less than 130/80 mmHg.  She is can take her blood pressure daily and she is going to send information to my office on Tuesday.  Should she still be elevated will add a low-dose of thiazide diuretic to her regimen.  I do think she can benefit from a diuretic.  Once were able to get the patient symptoms under control and understand what is going on she will resume her exercise but for now I have advised the patient to hold off any extraneous exercises.  The patient is in agreement with the above plan. The patient left the office in stable condition.  The patient will follow up in 2 weeks or sooner if needed.   Medication Adjustments/Labs and Tests Ordered: Current medicines are reviewed at length with the patient today.  Concerns regarding medicines are outlined above.  Orders Placed This Encounter  Procedures  . CT CORONARY MORPH W/CTA COR W/SCORE W/CA W/CM &/OR WO/CM  . CT CORONARY FRACTIONAL FLOW RESERVE DATA PREP  . CT CORONARY FRACTIONAL FLOW RESERVE FLUID ANALYSIS  . ECHOCARDIOGRAM COMPLETE   Meds ordered this encounter  Medications  . carvedilol (COREG) 12.5 MG tablet    Sig: Take 1 tablet (12.5 mg total) by mouth 2 (two) times daily.    Dispense:  180 tablet    Refill:  3  . metoprolol tartrate (LOPRESSOR) 100 MG tablet    Sig: Take 2 hours prior to CT    Dispense:  1 tablet    Refill:  0    Patient Instructions   Medication Instructions:  Your physician has recommended you make the following change in your  medication:  STOP:  Toprol-XL (Metoprolol Succinate) START: Coreg 12.5 mg twice daily *If you need  a refill on your cardiac medications before your next appointment, please call your pharmacy*   Lab Work: Your physician recommends that you return for lab work: 3-7 days before CT: BMET, Mag If you have labs (blood work) drawn today and your tests are completely normal, you will receive your results only by: Marland Kitchen MyChart Message (if you have MyChart) OR . A paper copy in the mail If you have any lab test that is abnormal or we need to change your treatment, we will call you to review the results.   Testing/Procedures: Your physician has requested that you have an echocardiogram. Echocardiography is a painless test that uses sound waves to create images of your heart. It provides your doctor with information about the size and shape of your heart and how well your heart's chambers and valves are working. This procedure takes approximately one hour. There are no restrictions for this procedure.  Your cardiac CT will be scheduled at one of the below locations:   [facility] [address] [place], [place] [address] [phone]  OR  [facility] [address] [place], [place] [address] [phone]  If scheduled at [facility], please arrive at the [facility] main entrance (entrance A) of [facility] 30 minutes prior to test start time. Proceed to the [facility] Radiology Department (first floor) to check-in and test prep.  If scheduled at [facility], please arrive 15 mins early for check-in and test prep.  Please follow these instructions carefully (unless otherwise directed):  Hold all erectile dysfunction medications at least 3 days (72 hrs) prior to test.  On the Night Before the Test: . Be sure to Drink plenty of water. . Do not consume any caffeinated/decaffeinated beverages  or chocolate 12 hours prior to your test. . Do not take any antihistamines 12 hours prior to your test. . If the patient has contrast allergy: ? Patient will need a prescription for Prednisone and very clear instructions (as follows): 1. Prednisone 50 mg - take 13 hours prior to test 2. Take another Prednisone 50 mg 7 hours prior to test 3. Take another Prednisone 50 mg 1 hour prior to test 4. Take Benadryl 50 mg 1 hour prior to test . Patient must complete all four doses of above prophylactic medications. . Patient will need a ride after test due to Benadryl.  On the Day of the Test: . Drink plenty of water until 1 hour prior to the test. . Do not eat any food 4 hours prior to the test. . You may take your regular medications prior to the test.  . Take metoprolol (Lopressor) two hours prior to test. . FEMALES- please wear underwire-free bra if available       After the Test: . Drink plenty of water. . After receiving IV contrast, you may experience a mild flushed feeling. This is normal. . On occasion, you may experience a mild rash up to 24 hours after the test. This is not dangerous. If this occurs, you can take Benadryl 25 mg and increase your fluid intake. . If you experience trouble breathing, this can be serious. If it is severe call 911 IMMEDIATELY. If it is mild, please call our office. . If you take any of these medications: Glipizide/Metformin, Avandament, Glucavance, please do not take 48 hours after completing test unless otherwise instructed.   Once we have confirmed authorization from your insurance company, we will call  you to set up a date and time for your test. Based on how quickly your insurance processes prior authorizations requests, please allow up to 4 weeks to be contacted for scheduling your Cardiac CT appointment. Be advised that routine Cardiac CT appointments could be scheduled as many as 8 weeks after your provider has ordered it.  For non-scheduling related  questions, please contact the cardiac imaging nurse navigator should you have any questions/concerns: Rockwell Alexandria, Cardiac Imaging Nurse Navigator Larey Brick, Cardiac Imaging Nurse Navigator [facility] Direct Office Dial: [phone]   For scheduling needs, including cancellations and rescheduling, please call [place], [phone].     Follow-Up: At [facility], you and your health needs are our priority.  As part of our continuing mission to provide you with exceptional heart care, we have created designated Provider Care Teams.  These Care Teams include your primary Cardiologist (physician) and Advanced Practice Providers (APPs -  Physician Assistants and Nurse Practitioners) who all work together to provide you with the care you need, when you need it.  We recommend signing up for the patient portal called "MyChart".  Sign up information is provided on this After Visit Summary.  MyChart is used to connect with patients for Virtual Visits (Telemedicine).  Patients are able to view lab/test results, encounter notes, upcoming appointments, etc.  Non-urgent messages can be sent to your provider as well.   To learn more about what you can do with MyChart, go to [email or web address].    Your next appointment:   2 week(s)  The format for your next appointment:   In Person  Provider:   Thomasene Ripple, DO   Other Instructions  Echocardiogram An echocardiogram is a test that uses sound waves (ultrasound) to produce images of the heart. Images from an echocardiogram can provide important information about:  Heart size and shape.  The size and thickness and movement of your heart's walls.  Heart muscle function and strength.  Heart valve function or if you have stenosis. Stenosis is when the heart valves are too narrow.  If blood is flowing backward through the heart valves (regurgitation).  A tumor or infectious growth around the heart  valves.  Areas of heart muscle that are not working well because of poor blood flow or injury from a heart attack.  Aneurysm detection. An aneurysm is a weak or damaged part of an artery wall. The wall bulges out from the normal force of blood pumping through the body. Tell a health care provider about:  Any allergies you have.  All medicines you are taking, including vitamins, herbs, eye drops, creams, and over-the-counter medicines.  Any blood disorders you have.  Any surgeries you have had.  Any medical conditions you have.  Whether you are pregnant or may be pregnant. What are the risks? Generally, this is a safe test. However, problems may occur, including an allergic reaction to dye (contrast) that may be used during the test. What happens before the test? No specific preparation is needed. You may eat and drink normally. What happens during the test?  You will take off your clothes from the waist up and put on a hospital gown.  Electrodes or electrocardiogram (ECG)patches may be placed on your chest. The electrodes or patches are then connected to a device that monitors your heart rate and rhythm.  You will lie down on a table for an ultrasound exam. A gel will be applied to your chest to help sound waves pass  through your skin.  A handheld device, called a transducer, will be pressed against your chest and moved over your heart. The transducer produces sound waves that travel to your heart and bounce back (or "echo" back) to the transducer. These sound waves will be captured in real-time and changed into images of your heart that can be viewed on a video monitor. The images will be recorded on a computer and reviewed by your health care provider.  You may be asked to change positions or hold your breath for a short time. This makes it easier to get different views or better views of your heart.  In some cases, you may receive contrast through an IV in one of your veins. This  can improve the quality of the pictures from your heart. The procedure may vary among health care providers and hospitals.   What can I expect after the test? You may return to your normal, everyday life, including diet, activities, and medicines, unless your health care provider tells you not to do that. Follow these instructions at home:  It is up to you to get the results of your test. Ask your health care provider, or the department that is doing the test, when your results will be ready.  Keep all follow-up visits. This is important. Summary  An echocardiogram is a test that uses sound waves (ultrasound) to produce images of the heart.  Images from an echocardiogram can provide important information about the size and shape of your heart, heart muscle function, heart valve function, and other possible heart problems.  You do not need to do anything to prepare before this test. You may eat and drink normally.  After the echocardiogram is completed, you may return to your normal, everyday life, unless your health care provider tells you not to do that. This information is not intended to replace advice given to you by your health care provider. Make sure you discuss any questions you have with your health care provider. Document Revised: 04/12/2020 Document Reviewed: 04/12/2020 Elsevier Patient Education  2021 Elsevier Inc.      Adopting a Healthy Lifestyle.  Know what a healthy weight is for you (roughly BMI <25) and aim to maintain this   Aim for 7+ servings of fruits and vegetables daily   65-80+ fluid ounces of water or unsweet tea for healthy kidneys   Limit to max 1 drink of alcohol per day; avoid smoking/tobacco   Limit animal fats in diet for cholesterol and heart health - choose grass fed whenever available   Avoid highly processed foods, and foods high in saturated/trans fats   Aim for low stress - take time to unwind and care for your mental health   Aim for  150 min of moderate intensity exercise weekly for heart health, and weights twice weekly for bone health   Aim for 7-9 hours of sleep daily   When it comes to diets, agreement about the perfect plan isnt easy to find, even among the experts. Experts at the [facility] [employer] developed an idea known as the Healthy Eating Plate. Just imagine a plate divided into logical, healthy portions.   The emphasis is on diet quality:   Load up on vegetables and fruits - one-half of your plate: Aim for color and variety, and remember that potatoes dont count.   Go for whole grains - one-quarter of your plate: Whole wheat, barley, wheat berries, quinoa, oats, brown rice, and foods made with them.  If you want pasta, go with whole wheat pasta.   Protein power - one-quarter of your plate: Fish, chicken, beans, and nuts are all healthy, versatile protein sources. Limit red meat.   The diet, however, does go beyond the plate, offering a few other suggestions.   Use healthy plant oils, such as olive, canola, soy, corn, sunflower and peanut. Check the labels, and avoid partially hydrogenated oil, which have unhealthy trans fats.   If youre thirsty, drink water. Coffee and tea are good in moderation, but skip sugary drinks and limit milk and dairy products to one or two daily servings.   The type of carbohydrate in the diet is more important than the amount. Some sources of carbohydrates, such as vegetables, fruits, whole grains, and beans-are healthier than others.   Finally, stay active  Signed, Thomasene Ripple, DO  11/23/2020 3:35 PM    [facility]

## 2020-11-28 ENCOUNTER — Telehealth (HOSPITAL_COMMUNITY): Payer: Self-pay | Admitting: Emergency Medicine

## 2020-11-28 NOTE — Telephone Encounter (Signed)
Reaching out to patient to offer assistance regarding upcoming cardiac imaging study; pt verbalizes understanding of appt date/time, parking situation and where to check in, pre-test NPO status and medications ordered, and verified current allergies; name and call back number provided for further questions should they arise Rockwell Alexandria RN Navigator Cardiac Imaging [facility] [phone] office [phone] cell  Pt holding daily carvediolol, will take metoprolol 2 hr prior to scan [place]

## 2020-11-29 LAB — BASIC METABOLIC PANEL
BUN/Creatinine Ratio: 13 (ref 9–23)
BUN: 13 mg/dL (ref 6–24)
CO2: 24 mmol/L (ref 20–29)
Calcium: 9.5 mg/dL (ref 8.7–10.2)
Chloride: 103 mmol/L (ref 96–106)
Creatinine, Ser: 1 mg/dL (ref 0.57–1.00)
Glucose: 106 mg/dL — ABNORMAL HIGH (ref 65–99)
Potassium: 4.1 mmol/L (ref 3.5–5.2)
Sodium: 142 mmol/L (ref 134–144)
eGFR: 65 mL/min/{1.73_m2} (ref >59)

## 2020-11-29 LAB — MAGNESIUM: Magnesium: 2.1 mg/dL (ref 1.6–2.3)

## 2020-11-30 ENCOUNTER — Ambulatory Visit (HOSPITAL_COMMUNITY)
Admission: RE | Admit: 2020-11-30 | Discharge: 2020-11-30 | Disposition: A | Discharge status: Home / Self Care | Payer: BC Managed Care – PPO | Source: Ambulatory Visit | Attending: Cardiology | Admitting: Cardiology

## 2020-11-30 ENCOUNTER — Other Ambulatory Visit: Payer: Self-pay

## 2020-11-30 DIAGNOSIS — R0789 Other chest pain: Secondary | ICD-10-CM

## 2020-11-30 DIAGNOSIS — R0600 Dyspnea, unspecified: Secondary | ICD-10-CM

## 2020-11-30 DIAGNOSIS — R0609 Other forms of dyspnea: Secondary | ICD-10-CM

## 2020-11-30 MED ORDER — NITROGLYCERIN 0.4 MG SL SUBL
0.8000 mg | SUBLINGUAL_TABLET | Freq: Once | SUBLINGUAL | Status: AC
  Administered 2020-11-30: 0.8 mg via SUBLINGUAL

## 2020-11-30 MED ORDER — IOHEXOL 350 MG/ML SOLN
80.0000 mL | Freq: Once | INTRAVENOUS | Status: AC | PRN
  Administered 2020-11-30: 80 mL via INTRAVENOUS

## 2020-11-30 MED ORDER — NITROGLYCERIN 0.4 MG SL SUBL
SUBLINGUAL_TABLET | SUBLINGUAL | Status: AC
  Filled 2020-11-30: qty 2

## 2020-12-02 ENCOUNTER — Other Ambulatory Visit: Payer: Self-pay | Admitting: Family Medicine

## 2020-12-02 ENCOUNTER — Other Ambulatory Visit: Payer: Self-pay

## 2020-12-02 DIAGNOSIS — R911 Solitary pulmonary nodule: Secondary | ICD-10-CM

## 2020-12-06 ENCOUNTER — Other Ambulatory Visit: Payer: Self-pay

## 2020-12-06 ENCOUNTER — Ambulatory Visit (HOSPITAL_COMMUNITY)
Admission: RE | Admit: 2020-12-06 | Discharge: 2020-12-06 | Disposition: A | Discharge status: Home / Self Care | Payer: BC Managed Care – PPO | Source: Ambulatory Visit | Attending: Cardiology | Admitting: Cardiology

## 2020-12-06 DIAGNOSIS — I10 Essential (primary) hypertension: Secondary | ICD-10-CM | POA: Diagnosis not present

## 2020-12-06 DIAGNOSIS — R0602 Shortness of breath: Secondary | ICD-10-CM

## 2020-12-06 DIAGNOSIS — E785 Hyperlipidemia, unspecified: Secondary | ICD-10-CM | POA: Diagnosis not present

## 2020-12-06 DIAGNOSIS — I351 Nonrheumatic aortic (valve) insufficiency: Secondary | ICD-10-CM | POA: Diagnosis not present

## 2020-12-06 LAB — ECHOCARDIOGRAM COMPLETE
Area-P 1/2: 2.6 cm2
S' Lateral: 2.7 cm

## 2020-12-08 ENCOUNTER — Ambulatory Visit: Payer: BC Managed Care – PPO | Admitting: Cardiology

## 2020-12-08 ENCOUNTER — Telehealth: Payer: Self-pay

## 2020-12-08 NOTE — Telephone Encounter (Signed)
Spoke with patient regarding results and recommendation.  Patient verbalizes understanding and is agreeable to plan of care. Advised patient to call back with any issues or concerns.  

## 2020-12-08 NOTE — Telephone Encounter (Signed)
-----   Message from Thomasene Ripple, DO sent at 12/07/2020  9:08 AM EDT ----- Echo normal

## 2020-12-09 ENCOUNTER — Other Ambulatory Visit: Payer: Self-pay

## 2020-12-09 ENCOUNTER — Ambulatory Visit: Payer: BC Managed Care – PPO | Admitting: Cardiology

## 2020-12-09 ENCOUNTER — Encounter: Payer: Self-pay | Admitting: Cardiology

## 2020-12-09 VITALS — BP 122/78 | HR 60 | Ht 63.0 in | Wt 200.6 lb

## 2020-12-09 DIAGNOSIS — E782 Mixed hyperlipidemia: Secondary | ICD-10-CM

## 2020-12-09 DIAGNOSIS — E669 Obesity, unspecified: Secondary | ICD-10-CM

## 2020-12-09 DIAGNOSIS — R079 Chest pain, unspecified: Secondary | ICD-10-CM

## 2020-12-09 DIAGNOSIS — I10 Essential (primary) hypertension: Secondary | ICD-10-CM | POA: Diagnosis not present

## 2020-12-09 DIAGNOSIS — R0602 Shortness of breath: Secondary | ICD-10-CM | POA: Diagnosis not present

## 2020-12-09 DIAGNOSIS — Z6833 Body mass index (BMI) 33.0-33.9, adult: Secondary | ICD-10-CM | POA: Insufficient documentation

## 2020-12-09 MED ORDER — RANOLAZINE ER 1000 MG PO TB12
1000.0000 mg | ORAL_TABLET | Freq: Two times a day (BID) | ORAL | 3 refills | Status: DC
Start: 2020-12-09 — End: 2021-03-03

## 2020-12-09 MED ORDER — CHLORTHALIDONE 25 MG PO TABS: 12.5000 mg | ORAL_TABLET | Freq: Every day | ORAL | 3 refills | Status: DC

## 2020-12-09 NOTE — Progress Notes (Signed)
Cardiology Office Note:    Date:  12/09/2020   ID:  [name], DOB April 13, 1961, MRN [id number]  PCP:  Blane Ohara, MD  Cardiologist:  Thomasene Ripple, DO  Electrophysiologist:  None   Referring MD: Blane Ohara, MD   I am having shortness of breath  History of Present Illness:    Caroline Kelly is a 60 y.o. female with a hx of hypertension, hyperlipidemia here today for follow-up visit.  Did see the patient on November 23, 2020 at that time she had been experiencing significant shortness of breath along with chest pain.  Given the symptomatology and her family history I recommended patient for coronary CTA.  In addition to recommending testing she was hypertensive I stopped her Toprol-XL and started the patient on carvedilol 12.5 mg twice daily.    She was able to get her coronary CTA she is here today for follow-up visit.  She is here with her husband.  Past Medical History:  Diagnosis Date  . Allergy   . GERD (gastroesophageal reflux disease)   . Hyperlipidemia   . Hypertension     Past Surgical History:  Procedure Laterality Date  . CESAREAN SECTION    . ENDOMETRIAL ABLATION  1989    Current Medications: Current Meds  Medication Sig  . albuterol (VENTOLIN HFA) 108 (90 Base) MCG/ACT inhaler Inhale 2 puffs into the lungs every 6 (six) hours as needed for wheezing or shortness of breath.  . Ascorbic Acid (VITAMIN C PO) Take 1 tablet by mouth daily.  Marland Kitchen aspirin EC 81 MG tablet Take 1 tablet (81 mg total) by mouth daily. Swallow whole.  Marland Kitchen atorvastatin (LIPITOR) 40 MG tablet Take 1 tablet (40 mg total) by mouth daily.  . calcium-vitamin D (OSCAL WITH D) 500-200 MG-UNIT tablet Take 1 tablet by mouth daily with breakfast.  . carvedilol (COREG) 12.5 MG tablet Take 1 tablet (12.5 mg total) by mouth 2 (two) times daily.  . chlorthalidone (HYGROTON) 25 MG tablet Take 0.5 tablets (12.5 mg total) by mouth daily.  . Coenzyme Q10 (CO Q 10 PO) Take 1 tablet by mouth daily at 6 (six) AM.  .  levothyroxine (SYNTHROID) 25 MCG tablet TAKE ONE TABLET BY MOUTH ONCE DAILY  . lisinopril (ZESTRIL) 40 MG tablet TAKE 1 TABLET BY MOUTH TWICE DAILY.  Marland Kitchen loratadine (CLARITIN) 10 MG tablet Take 1 tablet (10 mg total) by mouth daily.  . nitroGLYCERIN (NITROSTAT) 0.4 MG SL tablet Place 1 tablet (0.4 mg total) under the tongue every 5 (five) minutes as needed for chest pain.  Marland Kitchen omeprazole (PRILOSEC) 40 MG capsule TAKE 1 CAPSULE BY MOUTH ONCE DAILY.  . Probiotic Product (PROBIOTIC-10 PO) Take 1 tablet by mouth daily.  . ranolazine (RANEXA) 1000 MG SR tablet Take 1 tablet (1,000 mg total) by mouth 2 (two) times daily.  . [DISCONTINUED] amLODipine (NORVASC) 5 MG tablet Take 1 tablet (5 mg total) by mouth daily.  . [DISCONTINUED] chlorthalidone (HYGROTON) 25 MG tablet Take 1 tablet (25 mg total) by mouth daily.  . [DISCONTINUED] fluticasone (FLONASE) 50 MCG/ACT nasal spray Place 2 sprays into both nostrils daily.  . [DISCONTINUED] metoprolol tartrate (LOPRESSOR) 100 MG tablet Take 2 hours prior to CT  . [DISCONTINUED] VITAMIN D PO Take 1 tablet by mouth daily.     Allergies:   Levofloxacin   Social History   Socioeconomic History  . Marital status: Married    Spouse name: Not on file  . Number of children: Not on file  .  Years of education: Not on file  . Highest education level: Not on file  Occupational History  . Not on file  Tobacco Use  . Smoking status: Former Smoker    Types: Cigarettes    Quit date: 09/2017    Years since quitting: 3.2  . Smokeless tobacco: Never Used  Substance and Sexual Activity  . Alcohol use: Yes    Comment: twice monthly  . Drug use: Never  . Sexual activity: Not on file  Other Topics Concern  . Not on file  Social History Narrative  . Not on file   Social Determinants of Health   Financial Resource Strain: Not on file  Food Insecurity: Not on file  Transportation Needs: Not on file  Physical Activity: Not on file  Stress: Not on file  Social  Connections: Not on file     Family History: The patient's family history includes CAD in her father; Dementia in her mother; Heart failure in her mother; Hyperlipidemia in her father; Hypertension in her brother, father, and sister.  ROS:   Review of Systems  Constitution: Negative for decreased appetite, fever and weight gain.  HENT: Negative for congestion, ear discharge, hoarse voice and sore throat.   Eyes: Negative for discharge, redness, vision loss in right eye and visual halos.  Cardiovascular: Negative for chest pain, dyspnea on exertion, leg swelling, orthopnea and palpitations.  Respiratory: Negative for cough, hemoptysis, shortness of breath and snoring.   Endocrine: Negative for heat intolerance and polyphagia.  Hematologic/Lymphatic: Negative for bleeding problem. Does not bruise/bleed easily.  Skin: Negative for flushing, nail changes, rash and suspicious lesions.  Musculoskeletal: Negative for arthritis, joint pain, muscle cramps, myalgias, neck pain and stiffness.  Gastrointestinal: Negative for abdominal pain, bowel incontinence, diarrhea and excessive appetite.  Genitourinary: Negative for decreased libido, genital sores and incomplete emptying.  Neurological: Negative for brief paralysis, focal weakness, headaches and loss of balance.  Psychiatric/Behavioral: Negative for altered mental status, depression and suicidal ideas.  Allergic/Immunologic: Negative for HIV exposure and persistent infections.    EKGs/Labs/Other Studies Reviewed:    The following studies were reviewed today:   EKG: None today  Transthoracic echocardiogram December 07, 2018 IMPRESSIONS    1. Left ventricular ejection fraction, by estimation, is 60 to 65%. The  left ventricle has normal function. The left ventricle has no regional  wall motion abnormalities. There is mild left ventricular hypertrophy.  Left ventricular diastolic parameters  were normal.  2. Right ventricular systolic  function is normal. The right ventricular  size is normal. There is normal pulmonary artery systolic pressure. The  estimated right ventricular systolic pressure is 25.1 mmHg.  3. The mitral valve is grossly normal. No evidence of mitral valve  regurgitation.  4. The aortic valve is tricuspid. Aortic valve regurgitation is trivial.  5. The inferior vena cava is normal in size with greater than 50%  respiratory variability, suggesting right atrial pressure of 3 mmHg.   Comparison(s): No prior Echocardiogram.   Coronary CTA 11/30/2020  Aorta:  Normal size.  No calcifications.  No dissection.  Aortic Valve:  Trileaflet.  No calcifications.  Coronary Arteries:  Normal coronary origin.  Right dominance.  RCA is a large dominant artery that gives rise to PDA and PLA. There is no plaque.  Left main is a large artery that gives rise to LAD and LCX arteries.  LAD is a large vessel that has no plaque.  LCX is a non-dominant artery that gives rise to one large  OM1 branch. There is no plaque.  Other findings:  Normal pulmonary vein drainage into the left atrium.  Normal left atrial appendage without a thrombus.  Normal size of the pulmonary artery.  IMPRESSION: 1. Coronary calcium score of 0. This was 0 percentile for age and sex matched control.  2. Normal coronary origin with right dominance.  3. No evidence of CAD. CAD-RADS 0. No evidence of CAD (0%). Consider non-atherosclerotic causes of chest pain.    Recent Labs: 08/10/2020: TSH 2.200 11/17/2020: ALT 30; Hemoglobin 15.0; Platelets 303 11/28/2020: BUN 13; Creatinine, Ser 1.00; Magnesium 2.1; Potassium 4.1; Sodium 142  Recent Lipid Panel    Component Value Date/Time   CHOL 213 (H) 11/17/2020 1040   TRIG 104 11/17/2020 1040   HDL 51 11/17/2020 1040   CHOLHDL 4.2 11/17/2020 1040   LDLCALC 143 (H) 11/17/2020 1040    Physical Exam:    VS:  BP 110/64   Pulse 60   Ht 5\' 3"  (1.6 m)   Wt 200 lb 9.6 oz  (91 kg)   SpO2 97%   BMI 35.53 kg/m     Wt Readings from Last 3 Encounters:  12/09/20 200 lb 9.6 oz (91 kg)  11/23/20 200 lb (90.7 kg)  11/17/20 195 lb (88.5 kg)     GEN: Well nourished, well developed in no acute distress HEENT: Normal NECK: No JVD; No carotid bruits LYMPHATICS: No lymphadenopathy CARDIAC: S1S2 noted,RRR, no murmurs, rubs, gallops RESPIRATORY:  Clear to auscultation without rales, wheezing or rhonchi  ABDOMEN: Soft, non-tender, non-distended, +bowel sounds, no guarding. EXTREMITIES: No edema, No cyanosis, no clubbing MUSCULOSKELETAL:  No deformity  SKIN: Warm and dry NEUROLOGIC:  Alert and oriented x 3, non-focal PSYCHIATRIC:  Normal affect, good insight  ASSESSMENT:    1. Shortness of breath   2. Chest pain of uncertain etiology   3. Hypertension, unspecified type   4. Obesity (BMI 30-39.9)   5. Mixed hyperlipidemia    PLAN:     The patient is here in the office today.  We discussed her testing results.  Her coronary CTA RPL branch of the RCA distally is very narrow and cannot exclude coronary artery disease in that branch.  Therefore like to start the patient on antianginals.  She has significant baseline headache so were going to go ahead and start the patient on Ranexa hopefully this will help with her symptoms.  She will continue on her aspirin and her statin as well.  The goal will be to optimize her medical therapy for symptomatic control.  We also discussed her echocardiogram which only show mild LVH but essentially normal test.  The patient did bring some home blood pressure readings from home which I reviewed with her.  Her systolics are greater than 130s between 130-140.  There is 1 isolated systolic blood pressure which greater than 140.  Would like to do is optimized this patient's of her systolic is less than 130/80 mmHg.  She will remain on her carvedilol but I like to add low-dose diuretic chlorthalidone 12.5 mg to her regimen.  For shortness  of breath and her CAT scan showed some lung nodules were referred the patient.  Pulmonary.  The patient is in agreement with the above plan. The patient left the office in stable condition.  The patient will follow up in 8 weeks or sooner if needed.   Medication Adjustments/Labs and Tests Ordered: Current medicines are reviewed at length with the patient today.  Concerns regarding medicines are outlined  above.  No orders of the defined types were placed in this encounter.  Meds ordered this encounter  Medications  . chlorthalidone (HYGROTON) 25 MG tablet    Sig: Take 0.5 tablets (12.5 mg total) by mouth daily.    Dispense:  45 tablet    Refill:  3  . ranolazine (RANEXA) 1000 MG SR tablet    Sig: Take 1 tablet (1,000 mg total) by mouth 2 (two) times daily.    Dispense:  180 tablet    Refill:  3    Patient Instructions  Medication Instructions:  Your physician has recommended you make the following change in your medication:  START: Ranexa 1000 mg twice daily START: Chlorthalidone 12.5 mg once daily *If you need a refill on your cardiac medications before your next appointment, please call your pharmacy*   Lab Work: None If you have labs (blood work) drawn today and your tests are completely normal, you will receive your results only by: Marland Kitchen MyChart Message (if you have MyChart) OR . A paper copy in the mail If you have any lab test that is abnormal or we need to change your treatment, we will call you to review the results.   Testing/Procedures: None   Follow-Up: At [facility], you and your health needs are our priority.  As part of our continuing mission to provide you with exceptional heart care, we have created designated Provider Care Teams.  These Care Teams include your primary Cardiologist (physician) and Advanced Practice Providers (APPs -  Physician Assistants and Nurse Practitioners) who all work together to provide you with the care you need, when you need  it.  We recommend signing up for the patient portal called "MyChart".  Sign up information is provided on this After Visit Summary.  MyChart is used to connect with patients for Virtual Visits (Telemedicine).  Patients are able to view lab/test results, encounter notes, upcoming appointments, etc.  Non-urgent messages can be sent to your provider as well.   To learn more about what you can do with MyChart, go to [email or web address].    Your next appointment:   8 week(s)  The format for your next appointment:   In Person  Provider:   Thomasene Ripple, DO   Other Instructions      Adopting a Healthy Lifestyle.  Know what a healthy weight is for you (roughly BMI <25) and aim to maintain this   Aim for 7+ servings of fruits and vegetables daily   65-80+ fluid ounces of water or unsweet tea for healthy kidneys   Limit to max 1 drink of alcohol per day; avoid smoking/tobacco   Limit animal fats in diet for cholesterol and heart health - choose grass fed whenever available   Avoid highly processed foods, and foods high in saturated/trans fats   Aim for low stress - take time to unwind and care for your mental health   Aim for 150 min of moderate intensity exercise weekly for heart health, and weights twice weekly for bone health   Aim for 7-9 hours of sleep daily   When it comes to diets, agreement about the perfect plan isnt easy to find, even among the experts. Experts at the [facility] [employer] developed an idea known as the Healthy Eating Plate. Just imagine a plate divided into logical, healthy portions.   The emphasis is on diet quality:   Load up on vegetables and fruits - one-half of your plate: Aim for color and variety,  and remember that potatoes dont count.   Go for whole grains - one-quarter of your plate: Whole wheat, barley, wheat berries, quinoa, oats, brown rice, and foods made with them. If you want pasta, go with whole wheat pasta.   Protein  power - one-quarter of your plate: Fish, chicken, beans, and nuts are all healthy, versatile protein sources. Limit red meat.   The diet, however, does go beyond the plate, offering a few other suggestions.   Use healthy plant oils, such as olive, canola, soy, corn, sunflower and peanut. Check the labels, and avoid partially hydrogenated oil, which have unhealthy trans fats.   If youre thirsty, drink water. Coffee and tea are good in moderation, but skip sugary drinks and limit milk and dairy products to one or two daily servings.   The type of carbohydrate in the diet is more important than the amount. Some sources of carbohydrates, such as vegetables, fruits, whole grains, and beans-are healthier than others.   Finally, stay active  Signed, Thomasene Ripple, DO  12/09/2020 11:59 PM    [facility]

## 2020-12-09 NOTE — Patient Instructions (Addendum)
Medication Instructions:  Your physician has recommended you make the following change in your medication:  START: Ranexa 1000 mg twice daily START: Chlorthalidone 12.5 mg once daily *If you need a refill on your cardiac medications before your next appointment, please call your pharmacy*   Lab Work: None If you have labs (blood work) drawn today and your tests are completely normal, you will receive your results only by: Marland Kitchen MyChart Message (if you have MyChart) OR . A paper copy in the mail If you have any lab test that is abnormal or we need to change your treatment, we will call you to review the results.   Testing/Procedures: None   Follow-Up: At [facility], you and your health needs are our priority.  As part of our continuing mission to provide you with exceptional heart care, we have created designated Provider Care Teams.  These Care Teams include your primary Cardiologist (physician) and Advanced Practice Providers (APPs -  Physician Assistants and Nurse Practitioners) who all work together to provide you with the care you need, when you need it.  We recommend signing up for the patient portal called "MyChart".  Sign up information is provided on this After Visit Summary.  MyChart is used to connect with patients for Virtual Visits (Telemedicine).  Patients are able to view lab/test results, encounter notes, upcoming appointments, etc.  Non-urgent messages can be sent to your provider as well.   To learn more about what you can do with MyChart, go to [email or web address].    Your next appointment:   8 week(s)  The format for your next appointment:   In Person  Provider:   Thomasene Ripple, DO   Other Instructions

## 2020-12-10 ENCOUNTER — Encounter: Payer: Self-pay | Admitting: Cardiology

## 2020-12-13 ENCOUNTER — Other Ambulatory Visit: Payer: Self-pay

## 2020-12-13 DIAGNOSIS — R0602 Shortness of breath: Secondary | ICD-10-CM

## 2020-12-13 NOTE — Progress Notes (Signed)
Referral made 

## 2021-01-11 ENCOUNTER — Ambulatory Visit: Payer: BC Managed Care – PPO | Admitting: Pulmonary Disease

## 2021-01-11 ENCOUNTER — Encounter: Payer: Self-pay | Admitting: Pulmonary Disease

## 2021-01-11 ENCOUNTER — Other Ambulatory Visit: Payer: Self-pay

## 2021-01-11 VITALS — BP 122/76 | HR 66 | Temp 97.9°F | Ht 65.0 in | Wt 200.6 lb

## 2021-01-11 DIAGNOSIS — R0602 Shortness of breath: Secondary | ICD-10-CM

## 2021-01-11 DIAGNOSIS — R911 Solitary pulmonary nodule: Secondary | ICD-10-CM

## 2021-01-11 NOTE — Progress Notes (Signed)
SAE HANDRICH    [id number]    02/01/1961  Primary Care Physician:Cox, Fritzi Mandes, MD  Referring Physician: Thomasene Ripple, DO [address] [place],  [place] [address]  Chief complaint:   Referred for lung nodule Shortness of breath  HPI:  Had COVID January 2022 Had COVID a year prior Was not hospitalized but did have significant symptoms altered taste, altered smell Extended period of fatigue Fatigue started getting better after about 2 to 3 months Prescribed albuterol which she hardly ever needs to use  Past history of smoking quit about 2019, 1/2 a pack a day for about 20 years  Has had multiple pneumonias in the past  Usually very active She got back into the gym about a week ago  Denies any cough, no weight loss Does have a history of allergies, sinus congestion Denies any other respiratory complaints at present  She has hypertension Coronary artery disease which led to the CT scan being obtained  No previous CT scans of the chest, no previous x-ray abnormalities  No family history of lung cancer  No pertinent occupational history  Has gained weight with decreased activities   Outpatient Encounter Medications as of 01/11/2021  Medication Sig  . albuterol (VENTOLIN HFA) 108 (90 Base) MCG/ACT inhaler Inhale 2 puffs into the lungs every 6 (six) hours as needed for wheezing or shortness of breath.  . Ascorbic Acid (VITAMIN C PO) Take 1 tablet by mouth daily.  Marland Kitchen aspirin EC 81 MG tablet Take 1 tablet (81 mg total) by mouth daily. Swallow whole.  Marland Kitchen atorvastatin (LIPITOR) 40 MG tablet Take 1 tablet (40 mg total) by mouth daily.  . calcium-vitamin D (OSCAL WITH D) 500-200 MG-UNIT tablet Take 1 tablet by mouth daily with breakfast.  . carvedilol (COREG) 12.5 MG tablet Take 1 tablet (12.5 mg total) by mouth 2 (two) times daily.  . chlorthalidone (HYGROTON) 25 MG tablet Take 0.5 tablets (12.5 mg total) by mouth daily.  . Coenzyme Q10 (CO Q 10 PO) Take 1 tablet by  mouth daily at 6 (six) AM.  . levothyroxine (SYNTHROID) 25 MCG tablet TAKE ONE TABLET BY MOUTH ONCE DAILY  . lisinopril (ZESTRIL) 40 MG tablet TAKE 1 TABLET BY MOUTH TWICE DAILY.  Marland Kitchen loratadine (CLARITIN) 10 MG tablet Take 1 tablet (10 mg total) by mouth daily.  . nitroGLYCERIN (NITROSTAT) 0.4 MG SL tablet Place 1 tablet (0.4 mg total) under the tongue every 5 (five) minutes as needed for chest pain.  Marland Kitchen omeprazole (PRILOSEC) 40 MG capsule TAKE 1 CAPSULE BY MOUTH ONCE DAILY.  . Probiotic Product (PROBIOTIC-10 PO) Take 1 tablet by mouth daily.  . ranolazine (RANEXA) 1000 MG SR tablet Take 1 tablet (1,000 mg total) by mouth 2 (two) times daily.   No facility-administered encounter medications on file as of 01/11/2021.    Allergies as of 01/11/2021 - Review Complete 01/11/2021  Allergen Reaction Noted  . Levofloxacin Hives 02/27/2019  . Latex Rash 01/11/2021    Past Medical History:  Diagnosis Date  . Allergic rhinitis   . Allergy   . GERD (gastroesophageal reflux disease)   . Hyperlipidemia   . Hypertension     Past Surgical History:  Procedure Laterality Date  . CESAREAN SECTION    . ENDOMETRIAL ABLATION  1989    Family History  Problem Relation Age of Onset  . Heart failure Mother   . Dementia Mother   . Hyperlipidemia Father   . Hypertension Father   .  CAD Father   . Hypertension Sister   . Hypertension Brother     Social History   Socioeconomic History  . Marital status: Married    Spouse name: Not on file  . Number of children: Not on file  . Years of education: Not on file  . Highest education level: Not on file  Occupational History  . Not on file  Tobacco Use  . Smoking status: Former Smoker    Types: Cigarettes    Quit date: 09/2017    Years since quitting: 3.3  . Smokeless tobacco: Never Used  Substance and Sexual Activity  . Alcohol use: Yes    Comment: twice monthly  . Drug use: Never  . Sexual activity: Not on file  Other Topics Concern  . Not  on file  Social History Narrative  . Not on file   Social Determinants of Health   Financial Resource Strain: Not on file  Food Insecurity: Not on file  Transportation Needs: Not on file  Physical Activity: Not on file  Stress: Not on file  Social Connections: Not on file  Intimate Partner Violence: Not on file    Review of Systems  Constitutional: Negative for fatigue.  Respiratory: Negative for cough and shortness of breath.   Psychiatric/Behavioral: Negative for sleep disturbance.    Vitals:   01/11/21 0851  BP: 122/76  Pulse: 66  Temp: 97.9 F (36.6 C)  SpO2: 98%     Physical Exam Constitutional:      Appearance: She is obese.  HENT:     Head: Normocephalic and atraumatic.     Mouth/Throat:     Mouth: Mucous membranes are moist.  Eyes:     General:        Right eye: No discharge.        Left eye: No discharge.  Cardiovascular:     Rate and Rhythm: Normal rate and regular rhythm.     Pulses: Normal pulses.     Heart sounds: No murmur heard. No friction rub.  Pulmonary:     Effort: No respiratory distress.     Breath sounds: No stridor. No wheezing or rhonchi.  Musculoskeletal:     Cervical back: No rigidity or tenderness.  Neurological:     Mental Status: She is alert.  Psychiatric:        Mood and Affect: Mood normal.    Data Reviewed:   Assessment:  Lung nodule -6 mm nodule, reformed smoker -Unclear whether this was present over a long period of time -Characterization is difficult's not fully imaged on current study  History of coronary artery disease   Reformed smoker   Plan/Recommendations: Obtain CT scan of the chest in 6 weeks  PFT at next visit  Encouraged to call with any significant concerns   Virl Diamond MD [facility] 01/11/2021, 9:01 AM  CC: Thomasene Ripple, DO

## 2021-01-11 NOTE — Patient Instructions (Signed)
Schedule CT scan of the chest in 6 weeks  Schedule pulmonary function test to be done at next visit  I will see you in about 6 weeks to review CT and your breathing study  Albuterol use as needed  Call with significant concerns

## 2021-01-12 ENCOUNTER — Other Ambulatory Visit: Payer: Self-pay | Admitting: Family Medicine

## 2021-01-31 DIAGNOSIS — J309 Allergic rhinitis, unspecified: Secondary | ICD-10-CM | POA: Insufficient documentation

## 2021-02-03 ENCOUNTER — Ambulatory Visit (INDEPENDENT_AMBULATORY_CARE_PROVIDER_SITE_OTHER): Payer: BC Managed Care – PPO | Admitting: Legal Medicine

## 2021-02-03 ENCOUNTER — Ambulatory Visit: Payer: BC Managed Care – PPO | Admitting: Cardiology

## 2021-02-03 ENCOUNTER — Encounter: Payer: Self-pay | Admitting: Legal Medicine

## 2021-02-03 VITALS — BP 128/78 | Temp 97.5°F | Resp 18 | Wt 199.0 lb

## 2021-02-03 DIAGNOSIS — J029 Acute pharyngitis, unspecified: Secondary | ICD-10-CM | POA: Diagnosis not present

## 2021-02-03 DIAGNOSIS — J180 Bronchopneumonia, unspecified organism: Secondary | ICD-10-CM

## 2021-02-03 LAB — POCT RAPID STREP A (OFFICE): Rapid Strep A Screen: NEGATIVE

## 2021-02-03 MED ORDER — LEVOFLOXACIN 500 MG PO TABS: 500.0000 mg | ORAL_TABLET | Freq: Every day | ORAL | 0 refills | Status: DC

## 2021-02-03 MED ORDER — CLARITHROMYCIN 500 MG PO TABS: 500.0000 mg | ORAL_TABLET | Freq: Two times a day (BID) | ORAL | 0 refills | Status: DC

## 2021-02-03 MED ORDER — TRIAMCINOLONE ACETONIDE 40 MG/ML IJ SUSP
80.0000 mg | Freq: Once | INTRAMUSCULAR | Status: AC
  Administered 2021-02-03: 80 mg via INTRAMUSCULAR

## 2021-02-03 NOTE — Progress Notes (Signed)
Subjective:  Patient ID: Caroline Kelly, female    DOB: 04-16-1961  Age: 60 y.o. MRN: [id number]  Chief Complaint  Patient presents with  . Cough    HPI : sick for 3 days with bad cough, congestions, no fever or chills. Sore throat.  Cough hurst chest.   Current Outpatient Medications on File Prior to Visit  Medication Sig Dispense Refill  . albuterol (VENTOLIN HFA) 108 (90 Base) MCG/ACT inhaler Inhale 2 puffs into the lungs every 6 (six) hours as needed for wheezing or shortness of breath. 8 g 0  . Ascorbic Acid (VITAMIN C PO) Take 1 tablet by mouth daily.    Marland Kitchen aspirin EC 81 MG tablet Take 1 tablet (81 mg total) by mouth daily. Swallow whole. 30 tablet 11  . atorvastatin (LIPITOR) 40 MG tablet Take 1 tablet (40 mg total) by mouth daily. 90 tablet 0  . calcium-vitamin D (OSCAL WITH D) 500-200 MG-UNIT tablet Take 1 tablet by mouth daily with breakfast.    . carvedilol (COREG) 12.5 MG tablet Take 1 tablet (12.5 mg total) by mouth 2 (two) times daily. 180 tablet 3  . chlorthalidone (HYGROTON) 25 MG tablet Take 0.5 tablets (12.5 mg total) by mouth daily. 45 tablet 3  . Coenzyme Q10 (CO Q 10 PO) Take 1 tablet by mouth daily at 6 (six) AM.    . levothyroxine (SYNTHROID) 25 MCG tablet TAKE ONE TABLET BY MOUTH ONCE DAILY 90 tablet 3  . lisinopril (ZESTRIL) 40 MG tablet TAKE 1 TABLET BY MOUTH TWICE DAILY. 180 tablet 0  . loratadine (CLARITIN) 10 MG tablet Take 1 tablet (10 mg total) by mouth daily. 90 tablet 1  . nitroGLYCERIN (NITROSTAT) 0.4 MG SL tablet Place 1 tablet (0.4 mg total) under the tongue every 5 (five) minutes as needed for chest pain. 25 tablet 3  . omeprazole (PRILOSEC) 40 MG capsule TAKE 1 CAPSULE BY MOUTH ONCE DAILY. 90 capsule 3  . Probiotic Product (PROBIOTIC-10 PO) Take 1 tablet by mouth daily.    . ranolazine (RANEXA) 1000 MG SR tablet Take 1 tablet (1,000 mg total) by mouth 2 (two) times daily. 180 tablet 3   No current facility-administered medications on file prior to  visit.   Past Medical History:  Diagnosis Date  . Allergic rhinitis   . Allergy   . GERD (gastroesophageal reflux disease)   . Hyperlipidemia   . Hypertension    Past Surgical History:  Procedure Laterality Date  . CESAREAN SECTION    . ENDOMETRIAL ABLATION  1989    Family History  Problem Relation Age of Onset  . Heart failure Mother   . Dementia Mother   . Hyperlipidemia Father   . Hypertension Father   . CAD Father   . Hypertension Sister   . Hypertension Brother    Social History   Socioeconomic History  . Marital status: Married    Spouse name: Not on file  . Number of children: Not on file  . Years of education: Not on file  . Highest education level: Not on file  Occupational History  . Not on file  Tobacco Use  . Smoking status: Former Smoker    Types: Cigarettes    Quit date: 09/2017    Years since quitting: 3.4  . Smokeless tobacco: Never Used  Substance and Sexual Activity  . Alcohol use: Yes    Comment: twice monthly  . Drug use: Never  . Sexual activity: Not on file  Other Topics  Concern  . Not on file  Social History Narrative  . Not on file   Social Determinants of Health   Financial Resource Strain: Not on file  Food Insecurity: Not on file  Transportation Needs: Not on file  Physical Activity: Not on file  Stress: Not on file  Social Connections: Not on file    Review of Systems  Constitutional: Negative for activity change and appetite change.  HENT: Positive for congestion. Negative for sinus pain.   Eyes: Negative for visual disturbance.  Respiratory: Positive for cough. Negative for chest tightness and shortness of breath.   Cardiovascular: Negative for chest pain, palpitations and leg swelling.  Gastrointestinal: Negative for abdominal distention and abdominal pain.  Endocrine: Negative for polyuria.  Genitourinary: Negative for difficulty urinating.  Musculoskeletal: Negative for arthralgias and back pain.  Neurological:  Negative.   Psychiatric/Behavioral: Negative.      Objective:  BP 128/78   Temp (!) 97.5 F (36.4 C)   Resp 18   Wt 199 lb (90.3 kg)   BMI 33.12 kg/m   BP/Weight 02/03/2021 01/11/2021 12/09/2020  Systolic BP 128 122 122  Diastolic BP 78 76 78  Wt. (Lbs) 199 200.6 200.6  BMI 33.12 33.38 35.53    Physical Exam Vitals reviewed.  Constitutional:      Appearance: Normal appearance.  HENT:     Right Ear: Tympanic membrane normal.     Left Ear: Tympanic membrane normal.     Mouth/Throat:     Mouth: Mucous membranes are moist.     Pharynx: Oropharynx is clear.  Eyes:     Extraocular Movements: Extraocular movements intact.     Conjunctiva/sclera: Conjunctivae normal.     Pupils: Pupils are equal, round, and reactive to light.  Cardiovascular:     Rate and Rhythm: Normal rate and regular rhythm.     Pulses: Normal pulses.     Heart sounds: Normal heart sounds. No murmur heard. No gallop.   Pulmonary:     Effort: Pulmonary effort is normal.     Breath sounds: Rhonchi present.  Musculoskeletal:     Cervical back: Normal range of motion and neck supple.  Skin:    General: Skin is warm.  Neurological:     General: No focal deficit present.     Mental Status: She is alert.       Lab Results  Component Value Date   WBC 5.2 11/17/2020   HGB 15.0 11/17/2020   HCT 44.1 11/17/2020   PLT 303 11/17/2020   GLUCOSE 106 (H) 11/28/2020   CHOL 213 (H) 11/17/2020   TRIG 104 11/17/2020   HDL 51 11/17/2020   LDLCALC 143 (H) 11/17/2020   ALT 30 11/17/2020   AST 32 11/17/2020   NA 142 11/28/2020   K 4.1 11/28/2020   CL 103 11/28/2020   CREATININE 1.00 11/28/2020   BUN 13 11/28/2020   CO2 24 11/28/2020   TSH 2.200 08/10/2020      Assessment & Plan:   Diagnoses and all orders for this visit: Bronchial pneumonia -     triamcinolone acetonide (KENALOG-40) injection 80 mg -     clarithromycin (BIAXIN) 500 MG tablet; Take 1 tablet (500 mg total) by mouth 2 (two) times  daily. Patient has bronchial pneumonia and will be treated with clarithromycin and steroid Sore throat -     POCT rapid strep A- negative test        Follow-up: Return if symptoms worsen or fail to improve.  An After Visit Summary was printed and given to the patient.  Reinaldo Meeker, MD [facility] [phone]

## 2021-02-04 ENCOUNTER — Other Ambulatory Visit: Payer: Self-pay | Admitting: Family Medicine

## 2021-02-06 NOTE — Telephone Encounter (Signed)
Please ask pt to come for fasting labs. We need to check her liver function since this was started 3 months ago also. Schedule follow up in 3 months fasting after labs done. kc

## 2021-02-07 ENCOUNTER — Telehealth: Payer: Self-pay | Admitting: Cardiology

## 2021-02-07 ENCOUNTER — Other Ambulatory Visit: Payer: Self-pay

## 2021-02-07 DIAGNOSIS — E782 Mixed hyperlipidemia: Secondary | ICD-10-CM

## 2021-02-07 DIAGNOSIS — I10 Essential (primary) hypertension: Secondary | ICD-10-CM

## 2021-02-07 MED ORDER — ATORVASTATIN CALCIUM 40 MG PO TABS: 40.0000 mg | ORAL_TABLET | Freq: Every day | ORAL | 0 refills | Status: DC

## 2021-02-07 NOTE — Telephone Encounter (Signed)
Pt states that her BP is now running to low and she feels exhausted. Pt states that she wants to see what to do. Denies any stroke sx or chest pain. How do you advise?

## 2021-02-07 NOTE — Telephone Encounter (Signed)
Pt c/o BP issue: STAT if pt c/o blurred vision, one-sided weakness or slurred speech  1. What are your last 5 BP readings? 96/46, 108/47, 109/49  2. Are you having any other symptoms (ex. Dizziness, headache, blurred vision, passed out)? DIZZINESS  3. What is your BP issue?  PT SAYS THAT HER BP GETS REALLY LOW 96/46 OVER THE WEEEK, 108/47 AND 109/49 TODAY. DOESNT FEEL GOOD, REALLY WEEK AND DIZZY

## 2021-02-07 NOTE — Addendum Note (Signed)
Addended by: Eleonore Chiquito on: 02/07/2021 03:59 PM   Modules accepted: Orders

## 2021-02-07 NOTE — Telephone Encounter (Signed)
Recommendations reviewed with pt as per Dr. Tobb's note.  Pt verbalized understanding and had no additional questions.  

## 2021-02-07 NOTE — Telephone Encounter (Signed)
Please have her stop Chlorthiadone for now

## 2021-02-09 MED ORDER — LISINOPRIL 40 MG PO TABS: 40.0000 mg | ORAL_TABLET | Freq: Every day | ORAL | 3 refills | Status: DC

## 2021-02-09 NOTE — Telephone Encounter (Signed)
Per Dr. Dulce Sellar: I would reduce her lisinopril to 1 a day hold systolic less than 110 and she needs to be worked in day 1 Dr. Servando Salina is in the office double book  Recommendations reviewed with pt as per Dr. Hulen Shouts note.  Pt verbalized understanding and had no additional questions.

## 2021-02-09 NOTE — Addendum Note (Signed)
Addended by: Eleonore Chiquito on: 02/09/2021 04:25 PM   Modules accepted: Orders

## 2021-02-09 NOTE — Telephone Encounter (Signed)
Patient call to say the recommendations that Dr. Servando Salina suggested hasn't work. Patient states blood pressure is still low. 106/52; 98/51; 105/52; 102/49. Patient wants to know what else can be done because she doesn't like this feeling.

## 2021-02-10 ENCOUNTER — Telehealth: Payer: Self-pay | Admitting: Cardiology

## 2021-02-10 NOTE — Telephone Encounter (Signed)
Pt c/o BP issue: STAT if pt c/o blurred vision, one-sided weakness or slurred speech  1. What are your last 5 BP readings?       119/60 HR60 108/57 118/55 HR 61 122/58 HR59   2. Are you having any other symptoms (ex. Dizziness, headache, blurred vision, passed out)? Dizziness arms and legs feel weak when she moves around  3. What is your BP issue? Pt feels like her bottom number is too low. Pt states she did not take the Lisinopril yesterday but she took the BP medicine today

## 2021-02-10 NOTE — Telephone Encounter (Signed)
Spoke to Dr. Dulce Sellar in regards to this and he advised to have her stop her lisinopril all together. He also advised to send a message to [place], [place] and Dr. Servando Salina to address this further on Monday.   I spoke to the patient and let her know these recommendations and she verbalizes understanding. She is coming to see Dr. Servando Salina on Tuesday the 16th.

## 2021-02-15 ENCOUNTER — Other Ambulatory Visit: Payer: Self-pay

## 2021-02-15 ENCOUNTER — Other Ambulatory Visit: Payer: BC Managed Care – PPO

## 2021-02-15 DIAGNOSIS — I10 Essential (primary) hypertension: Secondary | ICD-10-CM

## 2021-02-15 DIAGNOSIS — E782 Mixed hyperlipidemia: Secondary | ICD-10-CM | POA: Diagnosis not present

## 2021-02-16 ENCOUNTER — Ambulatory Visit: Payer: BC Managed Care – PPO | Admitting: Cardiology

## 2021-02-16 ENCOUNTER — Encounter: Payer: Self-pay | Admitting: Cardiology

## 2021-02-16 VITALS — BP 122/68 | HR 60 | Ht 63.0 in | Wt 195.0 lb

## 2021-02-16 DIAGNOSIS — E782 Mixed hyperlipidemia: Secondary | ICD-10-CM

## 2021-02-16 DIAGNOSIS — I10 Essential (primary) hypertension: Secondary | ICD-10-CM | POA: Diagnosis not present

## 2021-02-16 DIAGNOSIS — E669 Obesity, unspecified: Secondary | ICD-10-CM

## 2021-02-16 LAB — CBC WITH DIFFERENTIAL/PLATELET
Basophils Absolute: 0.1 10*3/uL (ref 0.0–0.2)
Basos: 1 %
EOS (ABSOLUTE): 0.1 10*3/uL (ref 0.0–0.4)
Eos: 2 %
Hematocrit: 42.8 % (ref 34.0–46.6)
Hemoglobin: 13.9 g/dL (ref 11.1–15.9)
Immature Grans (Abs): 0 10*3/uL (ref 0.0–0.1)
Immature Granulocytes: 0 %
Lymphocytes Absolute: 2.9 10*3/uL (ref 0.7–3.1)
Lymphs: 46 %
MCH: 30 pg (ref 26.6–33.0)
MCHC: 32.5 g/dL (ref 31.5–35.7)
MCV: 92 fL (ref 79–97)
Monocytes Absolute: 0.6 10*3/uL (ref 0.1–0.9)
Monocytes: 10 %
Neutrophils Absolute: 2.5 10*3/uL (ref 1.4–7.0)
Neutrophils: 41 %
Platelets: 295 10*3/uL (ref 150–450)
RBC: 4.63 x10E6/uL (ref 3.77–5.28)
RDW: 13.6 % (ref 11.7–15.4)
WBC: 6.1 10*3/uL (ref 3.4–10.8)

## 2021-02-16 LAB — COMPREHENSIVE METABOLIC PANEL
ALT: 34 IU/L — ABNORMAL HIGH (ref 0–32)
AST: 29 IU/L (ref 0–40)
Albumin/Globulin Ratio: 2 (ref 1.2–2.2)
Albumin: 4.5 g/dL (ref 3.8–4.9)
Alkaline Phosphatase: 79 IU/L (ref 44–121)
BUN/Creatinine Ratio: 17 (ref 9–23)
BUN: 19 mg/dL (ref 6–24)
Bilirubin Total: 0.5 mg/dL (ref 0.0–1.2)
CO2: 25 mmol/L (ref 20–29)
Calcium: 9.7 mg/dL (ref 8.7–10.2)
Chloride: 99 mmol/L (ref 96–106)
Creatinine, Ser: 1.09 mg/dL — ABNORMAL HIGH (ref 0.57–1.00)
Globulin, Total: 2.3 g/dL (ref 1.5–4.5)
Glucose: 87 mg/dL (ref 65–99)
Potassium: 4.7 mmol/L (ref 3.5–5.2)
Sodium: 138 mmol/L (ref 134–144)
Total Protein: 6.8 g/dL (ref 6.0–8.5)
eGFR: 59 mL/min/{1.73_m2} — ABNORMAL LOW (ref >59)

## 2021-02-16 LAB — LIPID PANEL
Chol/HDL Ratio: 4 ratio (ref 0.0–4.4)
Cholesterol, Total: 238 mg/dL — ABNORMAL HIGH (ref 100–199)
HDL: 60 mg/dL (ref >39)
LDL Chol Calc (NIH): 161 mg/dL — ABNORMAL HIGH (ref 0–99)
Triglycerides: 99 mg/dL (ref 0–149)
VLDL Cholesterol Cal: 17 mg/dL (ref 5–40)

## 2021-02-16 LAB — CARDIOVASCULAR RISK ASSESSMENT

## 2021-02-16 NOTE — Progress Notes (Signed)
Cardiology Office Note:    Date:  02/16/2021   ID:  Caroline SchoolPhyllis A Hissong, DOB 02-Nov-1960, MRN [id number]  PCP:  Blane Oharaox, Kirsten, MD  Cardiologist:  Thomasene RippleKardie Yuritzy Zehring, DO  Electrophysiologist:  None   Referring MD: Blane Oharaox, Kirsten, MD   No chief complaint on file. I feel a lot better  History of Present Illness:    Caroline Kelly is a 60 y.o. female with a hx of hypertension, hyperlipidemia here today for follow-up visit.  Did see the patient on November 23, 2020 at that time she had been experiencing significant shortness of breath along with chest pain.  Given the symptomatology and her family history I recommended patient for coronary CTA.  In addition to recommending testing she was hypertensive I stopped her Toprol-XL and started the patient on carvedilol 12.5 mg twice daily.    I saw the patient on December 09, 2020 at that time we will discuss her testing results.  She still was having some chest discomfort I started her on Ranexa and continue her aspirin and statin.   She was hypertensive in the office I had lowered dose chlorthalidone to her regimen.  In the interim the patient reports that she had been experiencing lightheadedness.  The chlorthalidone, she did not tolerate her carvedilol but this was stopped and she is now on lisinopril.  Past Medical History:  Diagnosis Date   Allergic rhinitis    Allergy    GERD (gastroesophageal reflux disease)    Hyperlipidemia    Hypertension     Past Surgical History:  Procedure Laterality Date   CESAREAN SECTION     ENDOMETRIAL ABLATION  1989    Current Medications: Current Meds  Medication Sig   albuterol (VENTOLIN HFA) 108 (90 Base) MCG/ACT inhaler Inhale 2 puffs into the lungs every 6 (six) hours as needed for wheezing or shortness of breath.   Ascorbic Acid (VITAMIN C PO) Take 1 tablet by mouth daily.   aspirin EC 81 MG tablet Take 1 tablet (81 mg total) by mouth daily. Swallow whole.   atorvastatin (LIPITOR) 40 MG tablet Take 1 tablet (40 mg  total) by mouth daily.   calcium-vitamin D (OSCAL WITH D) 500-200 MG-UNIT tablet Take 1 tablet by mouth daily with breakfast.   clarithromycin (BIAXIN) 500 MG tablet Take 1 tablet (500 mg total) by mouth 2 (two) times daily.   Coenzyme Q10 (CO Q 10 PO) Take 1 tablet by mouth daily at 6 (six) AM.   levothyroxine (SYNTHROID) 25 MCG tablet TAKE ONE TABLET BY MOUTH ONCE DAILY   lisinopril (ZESTRIL) 40 MG tablet Take 40 mg by mouth in the morning and at bedtime.   loratadine (CLARITIN) 10 MG tablet Take 1 tablet (10 mg total) by mouth daily.   nitroGLYCERIN (NITROSTAT) 0.4 MG SL tablet Place 1 tablet (0.4 mg total) under the tongue every 5 (five) minutes as needed for chest pain.   omeprazole (PRILOSEC) 40 MG capsule TAKE 1 CAPSULE BY MOUTH ONCE DAILY.   Probiotic Product (PROBIOTIC-10 PO) Take 1 tablet by mouth daily.   ranolazine (RANEXA) 1000 MG SR tablet Take 1 tablet (1,000 mg total) by mouth 2 (two) times daily.   [DISCONTINUED] carvedilol (COREG) 12.5 MG tablet Take 1 tablet (12.5 mg total) by mouth 2 (two) times daily.     Allergies:   Levofloxacin and Latex   Social History   Socioeconomic History   Marital status: Married    Spouse name: Not on file   Number of children:  Not on file   Years of education: Not on file   Highest education level: Not on file  Occupational History   Not on file  Tobacco Use   Smoking status: Former    Pack years: 0.00    Types: Cigarettes    Quit date: 09/2017    Years since quitting: 3.4   Smokeless tobacco: Never  Substance and Sexual Activity   Alcohol use: Yes    Comment: twice monthly   Drug use: Never   Sexual activity: Not on file  Other Topics Concern   Not on file  Social History Narrative   Not on file   Social Determinants of Health   Financial Resource Strain: Not on file  Food Insecurity: Not on file  Transportation Needs: Not on file  Physical Activity: Not on file  Stress: Not on file  Social Connections: Not on file      Family History: The patient's family history includes CAD in her father; Dementia in her mother; Heart failure in her mother; Hyperlipidemia in her father; Hypertension in her brother, father, and sister.  ROS:   Review of Systems  Constitution: Negative for decreased appetite, fever and weight gain.  HENT: Negative for congestion, ear discharge, hoarse voice and sore throat.   Eyes: Negative for discharge, redness, vision loss in right eye and visual halos.  Cardiovascular: Negative for chest pain, dyspnea on exertion, leg swelling, orthopnea and palpitations.  Respiratory: Negative for cough, hemoptysis, shortness of breath and snoring.   Endocrine: Negative for heat intolerance and polyphagia.  Hematologic/Lymphatic: Negative for bleeding problem. Does not bruise/bleed easily.  Skin: Negative for flushing, nail changes, rash and suspicious lesions.  Musculoskeletal: Negative for arthritis, joint pain, muscle cramps, myalgias, neck pain and stiffness.  Gastrointestinal: Negative for abdominal pain, bowel incontinence, diarrhea and excessive appetite.  Genitourinary: Negative for decreased libido, genital sores and incomplete emptying.  Neurological: Negative for brief paralysis, focal weakness, headaches and loss of balance.  Psychiatric/Behavioral: Negative for altered mental status, depression and suicidal ideas.  Allergic/Immunologic: Negative for HIV exposure and persistent infections.    EKGs/Labs/Other Studies Reviewed:    The following studies were reviewed today:   EKG: None today   Transthoracic echocardiogram December 07, 2018 IMPRESSIONS   1. Left ventricular ejection fraction, by estimation, is 60 to 65%. The left ventricle has normal function. The left ventricle has no regional wall motion abnormalities. There is mild left ventricular hypertrophy.  Left ventricular diastolic parameters were normal.   2. Right ventricular systolic function is normal. The right  ventricular size is normal. There is normal pulmonary artery systolic pressure. The estimated right ventricular systolic pressure is 25.1 mmHg.   3. The mitral valve is grossly normal. No evidence of mitral valve regurgitation.   4. The aortic valve is tricuspid. Aortic valve regurgitation is trivial.  5. The inferior vena cava is normal in size with greater than 50%  respiratory variability, suggesting right atrial pressure of 3 mmHg.   Comparison(s): No prior Echocardiogram.   Coronary CTA 11/30/2020   Aorta:  Normal size.  No calcifications.  No dissection.   Aortic Valve:  Trileaflet.  No calcifications.   Coronary Arteries:  Normal coronary origin.  Right dominance.   RCA is a large dominant artery that gives rise to PDA and PLA. There is no plaque.   Left main is a large artery that gives rise to LAD and LCX arteries.   LAD is a large vessel that has no plaque.  LCX is a non-dominant artery that gives rise to one large OM1 branch. There is no plaque.   Other findings:   Normal pulmonary vein drainage into the left atrium.   Normal left atrial appendage without a thrombus.   Normal size of the pulmonary artery.   IMPRESSION: 1. Coronary calcium score of 0. This was 0 percentile for age and sex matched control.   2. Normal coronary origin with right dominance.   3. No evidence of CAD. CAD-RADS 0. No evidence of CAD (0%). Consider non-atherosclerotic causes of chest pain.    Recent Labs: 08/10/2020: TSH 2.200 11/28/2020: Magnesium 2.1 02/15/2021: ALT 34; BUN 19; Creatinine, Ser 1.09; Hemoglobin 13.9; Platelets 295; Potassium 4.7; Sodium 138  Recent Lipid Panel    Component Value Date/Time   CHOL 238 (H) 02/15/2021 0819   TRIG 99 02/15/2021 0819   HDL 60 02/15/2021 0819   CHOLHDL 4.0 02/15/2021 0819   LDLCALC 161 (H) 02/15/2021 0819    Physical Exam:    VS:  BP 122/68   Pulse 60   Ht 5\' 3"  (1.6 m)   Wt 195 lb (88.5 kg)   SpO2 98%   BMI 34.54 kg/m      Wt Readings from Last 3 Encounters:  02/16/21 195 lb (88.5 kg)  02/03/21 199 lb (90.3 kg)  01/11/21 200 lb 9.6 oz (91 kg)     GEN: Well nourished, well developed in no acute distress HEENT: Normal NECK: No JVD; No carotid bruits LYMPHATICS: No lymphadenopathy CARDIAC: S1S2 noted,RRR, no murmurs, rubs, gallops RESPIRATORY:  Clear to auscultation without rales, wheezing or rhonchi  ABDOMEN: Soft, non-tender, non-distended, +bowel sounds, no guarding. EXTREMITIES: No edema, No cyanosis, no clubbing MUSCULOSKELETAL:  No deformity  SKIN: Warm and dry NEUROLOGIC:  Alert and oriented x 3, non-focal PSYCHIATRIC:  Normal affect, good insight  ASSESSMENT:    1. Hypertension, unspecified type   2. Mixed hyperlipidemia   3. Obesity (BMI 30-39.9)    PLAN:     1.  She is doing well on the lisinopril alone.  She will continue to take that medication. She did stop the Lipitor I advised the patient that it would be beneficial for her to restart her Lipitor she is agreeable to this as well as her Ranexa. The patient understands the need to lose weight with diet and exercise. We have discussed specific strategies for this. The patient is in agreement with the above plan. The patient left the office in stable condition.  The patient will follow up in 6 months   Medication Adjustments/Labs and Tests Ordered: Current medicines are reviewed at length with the patient today.  Concerns regarding medicines are outlined above.  No orders of the defined types were placed in this encounter.  No orders of the defined types were placed in this encounter.   Patient Instructions  Medication Instructions:  Your physician recommends that you continue on your current medications as directed. Please refer to the Current Medication list given to you today.  *If you need a refill on your cardiac medications before your next appointment, please call your pharmacy*   Lab Work: None If you have labs  (blood work) drawn today and your tests are completely normal, you will receive your results only by: MyChart Message (if you have MyChart) OR A paper copy in the mail If you have any lab test that is abnormal or we need to change your treatment, we will call you to review the results.   Testing/Procedures:  None   Follow-Up: At [facility], you and your health needs are our priority.  As part of our continuing mission to provide you with exceptional heart care, we have created designated Provider Care Teams.  These Care Teams include your primary Cardiologist (physician) and Advanced Practice Providers (APPs -  Physician Assistants and Nurse Practitioners) who all work together to provide you with the care you need, when you need it.  We recommend signing up for the patient portal called "MyChart".  Sign up information is provided on this After Visit Summary.  MyChart is used to connect with patients for Virtual Visits (Telemedicine).  Patients are able to view lab/test results, encounter notes, upcoming appointments, etc.  Non-urgent messages can be sent to your provider as well.   To learn more about what you can do with MyChart, go to [email or web address].    Your next appointment:   1 year(s)  The format for your next appointment:   In Person  Provider:   Norman Herrlich, MD   Other Instructions    Adopting a Healthy Lifestyle.  Know what a healthy weight is for you (roughly BMI <25) and aim to maintain this   Aim for 7+ servings of fruits and vegetables daily   65-80+ fluid ounces of water or unsweet tea for healthy kidneys   Limit to max 1 drink of alcohol per day; avoid smoking/tobacco   Limit animal fats in diet for cholesterol and heart health - choose grass fed whenever available   Avoid highly processed foods, and foods high in saturated/trans fats   Aim for low stress - take time to unwind and care for your mental health   Aim for 150 min of moderate  intensity exercise weekly for heart health, and weights twice weekly for bone health   Aim for 7-9 hours of sleep daily   When it comes to diets, agreement about the perfect plan isnt easy to find, even among the experts. Experts at the [facility] of [employer] developed an idea known as the Healthy Eating Plate. Just imagine a plate divided into logical, healthy portions.   The emphasis is on diet quality:   Load up on vegetables and fruits - one-half of your plate: Aim for color and variety, and remember that potatoes dont count.   Go for whole grains - one-quarter of your plate: Whole wheat, barley, wheat berries, quinoa, oats, brown rice, and foods made with them. If you want pasta, go with whole wheat pasta.   Protein power - one-quarter of your plate: Fish, chicken, beans, and nuts are all healthy, versatile protein sources. Limit red meat.   The diet, however, does go beyond the plate, offering a few other suggestions.   Use healthy plant oils, such as olive, canola, soy, corn, sunflower and peanut. Check the labels, and avoid partially hydrogenated oil, which have unhealthy trans fats.   If youre thirsty, drink water. Coffee and tea are good in moderation, but skip sugary drinks and limit milk and dairy products to one or two daily servings.   The type of carbohydrate in the diet is more important than the amount. Some sources of carbohydrates, such as vegetables, fruits, whole grains, and beans-are healthier than others.   Finally, stay active  Signed, Thomasene Ripple, DO  02/16/2021 1:06 PM    [facility]

## 2021-02-16 NOTE — Patient Instructions (Signed)

## 2021-02-17 ENCOUNTER — Other Ambulatory Visit: Payer: Self-pay | Admitting: Physician Assistant

## 2021-02-17 ENCOUNTER — Telehealth: Payer: Self-pay | Admitting: Cardiology

## 2021-02-17 NOTE — Telephone Encounter (Signed)
*  STAT* If patient is at the pharmacy, call can be transferred to refill team.   1. Which medications need to be refilled? (please list name of each medication and dose if known) lisinopril (ZESTRIL) 40 MG tablet  2. Which pharmacy/location (including street and city if local pharmacy) is medication to be sent to?   [facility] - [place], [place] - [address]  3. Do they need a 30 day or 90 day supply? 30 ds

## 2021-02-17 NOTE — Telephone Encounter (Signed)
Rx was sent by patients PCP

## 2021-02-27 ENCOUNTER — Other Ambulatory Visit: Payer: Self-pay

## 2021-02-27 ENCOUNTER — Ambulatory Visit (HOSPITAL_BASED_OUTPATIENT_CLINIC_OR_DEPARTMENT_OTHER)
Admission: RE | Admit: 2021-02-27 | Discharge: 2021-02-27 | Disposition: A | Discharge status: Home / Self Care | Payer: BC Managed Care – PPO | Source: Ambulatory Visit | Attending: Pulmonary Disease | Admitting: Pulmonary Disease

## 2021-02-27 DIAGNOSIS — R911 Solitary pulmonary nodule: Secondary | ICD-10-CM

## 2021-02-27 DIAGNOSIS — J9811 Atelectasis: Secondary | ICD-10-CM | POA: Diagnosis not present

## 2021-02-27 DIAGNOSIS — I700 Atherosclerosis of aorta: Secondary | ICD-10-CM | POA: Diagnosis not present

## 2021-02-28 ENCOUNTER — Other Ambulatory Visit (HOSPITAL_COMMUNITY)
Admission: RE | Admit: 2021-02-28 | Discharge: 2021-02-28 | Disposition: A | Discharge status: Home / Self Care | Payer: BC Managed Care – PPO | Source: Ambulatory Visit | Attending: Pulmonary Disease | Admitting: Pulmonary Disease

## 2021-02-28 DIAGNOSIS — Z01812 Encounter for preprocedural laboratory examination: Secondary | ICD-10-CM | POA: Diagnosis not present

## 2021-02-28 DIAGNOSIS — Z20822 Contact with and (suspected) exposure to covid-19: Secondary | ICD-10-CM | POA: Insufficient documentation

## 2021-02-28 LAB — SARS CORONAVIRUS 2 (TAT 6-24 HRS): SARS Coronavirus 2: NEGATIVE

## 2021-03-03 ENCOUNTER — Encounter: Payer: Self-pay | Admitting: Pulmonary Disease

## 2021-03-03 ENCOUNTER — Ambulatory Visit: Payer: BC Managed Care – PPO | Admitting: Pulmonary Disease

## 2021-03-03 ENCOUNTER — Ambulatory Visit (INDEPENDENT_AMBULATORY_CARE_PROVIDER_SITE_OTHER): Payer: BC Managed Care – PPO | Admitting: Pulmonary Disease

## 2021-03-03 ENCOUNTER — Other Ambulatory Visit: Payer: Self-pay

## 2021-03-03 VITALS — BP 118/74 | HR 61 | Temp 97.6°F | Ht 63.0 in | Wt 199.0 lb

## 2021-03-03 DIAGNOSIS — R9389 Abnormal findings on diagnostic imaging of other specified body structures: Secondary | ICD-10-CM

## 2021-03-03 DIAGNOSIS — R0602 Shortness of breath: Secondary | ICD-10-CM | POA: Diagnosis not present

## 2021-03-03 LAB — PULMONARY FUNCTION TEST
DL/VA % pred: 132 %
DL/VA: 5.62 ml/min/mmHg/L
DLCO cor % pred: 105 %
DLCO cor: 20.65 ml/min/mmHg
DLCO unc % pred: 106 %
DLCO unc: 20.96 ml/min/mmHg
FEF 25-75 Post: 2.73 L/s
FEF 25-75 Pre: 2.33 L/s
FEF2575-%Change-Post: 17 %
FEF2575-%Pred-Post: 117 %
FEF2575-%Pred-Pre: 99 %
FEV1-%Change-Post: 4 %
FEV1-%Pred-Post: 85 %
FEV1-%Pred-Pre: 81 %
FEV1-Post: 2.12 L
FEV1-Pre: 2.02 L
FEV1FVC-%Change-Post: 4 %
FEV1FVC-%Pred-Pre: 110 %
FEV6-%Change-Post: 0 %
FEV6-%Pred-Post: 75 %
FEV6-%Pred-Pre: 75 %
FEV6-Post: 2.35 L
FEV6-Pre: 2.34 L
FEV6FVC-%Pred-Post: 103 %
FEV6FVC-%Pred-Pre: 103 %
FVC-%Change-Post: 0 %
FVC-%Pred-Post: 73 %
FVC-%Pred-Pre: 72 %
FVC-Post: 2.35 L
FVC-Pre: 2.34 L
Post FEV1/FVC ratio: 90 %
Post FEV6/FVC ratio: 100 %
Pre FEV1/FVC ratio: 86 %
Pre FEV6/FVC Ratio: 100 %
RV % pred: 83 %
RV: 1.6 L
TLC % pred: 83 %
TLC: 4.07 L

## 2021-03-03 NOTE — Patient Instructions (Signed)
Spot in her lung is resolved completely  Breathing studies within normal limits  Call with any significant concerns  I will see you as needed

## 2021-03-03 NOTE — Progress Notes (Signed)
Caroline Kelly    [id number]    May 28, 1961  Primary Care Physician:Cox, Fritzi Mandes, MD  Referring Physician: Blane Ohara, MD [address] [place],  [place] [address]  Chief complaint:   Follow-up for lung nodule and shortness of breath  HPI: Breathing has been a lot better  Did have a recent treatment for lower respiratory infection  Had COVID January 2022 Had COVID a year prior Was not hospitalized but did have significant symptoms altered taste, altered smell Extended period of fatigue Fatigue started getting better after about 2 to 3 months Prescribed albuterol which she hardly ever needs to use  Past history of smoking quit about 2019, 1/2 a pack a day for about 20 years  Has had multiple pneumonias in the past  She remains very active  Denies any cough, no weight loss Does have a history of allergies, sinus congestion Denies any other respiratory complaints at present  She has hypertension Coronary artery disease which led to the CT scan being obtained  No previous CT scans of the chest, no previous x-ray abnormalities  No family history of lung cancer  No pertinent occupational history  Outpatient Encounter Medications as of 03/03/2021  Medication Sig   Ascorbic Acid (VITAMIN C PO) Take 1 tablet by mouth daily.   aspirin EC 81 MG tablet Take 1 tablet (81 mg total) by mouth daily. Swallow whole.   atorvastatin (LIPITOR) 40 MG tablet Take 1 tablet (40 mg total) by mouth daily.   calcium-vitamin D (OSCAL WITH D) 500-200 MG-UNIT tablet Take 1 tablet by mouth daily with breakfast.   Coenzyme Q10 (CO Q 10 PO) Take 1 tablet by mouth daily at 6 (six) AM.   levothyroxine (SYNTHROID) 25 MCG tablet TAKE ONE TABLET BY MOUTH ONCE DAILY   lisinopril (ZESTRIL) 40 MG tablet TAKE 1 TABLET BY MOUTH TWICE DAILY.   loratadine (CLARITIN) 10 MG tablet Take 1 tablet (10 mg total) by mouth daily.   nitroGLYCERIN (NITROSTAT) 0.4 MG SL tablet Place 1 tablet (0.4 mg  total) under the tongue every 5 (five) minutes as needed for chest pain.   omeprazole (PRILOSEC) 40 MG capsule TAKE 1 CAPSULE BY MOUTH ONCE DAILY.   Probiotic Product (PROBIOTIC-10 PO) Take 1 tablet by mouth daily.   [DISCONTINUED] clarithromycin (BIAXIN) 500 MG tablet Take 1 tablet (500 mg total) by mouth 2 (two) times daily.   [DISCONTINUED] ranolazine (RANEXA) 1000 MG SR tablet Take 1 tablet (1,000 mg total) by mouth 2 (two) times daily.   albuterol (VENTOLIN HFA) 108 (90 Base) MCG/ACT inhaler Inhale 2 puffs into the lungs every 6 (six) hours as needed for wheezing or shortness of breath. (Patient not taking: Reported on 03/03/2021)   No facility-administered encounter medications on file as of 03/03/2021.    Allergies as of 03/03/2021 - Review Complete 03/03/2021  Allergen Reaction Noted   Levofloxacin Hives 02/27/2019   Latex Rash 01/11/2021    Past Medical History:  Diagnosis Date   Allergic rhinitis    Allergy    GERD (gastroesophageal reflux disease)    Hyperlipidemia    Hypertension     Past Surgical History:  Procedure Laterality Date   CESAREAN SECTION     ENDOMETRIAL ABLATION  1989    Family History  Problem Relation Age of Onset   Heart failure Mother    Dementia Mother    Hyperlipidemia Father    Hypertension Father    CAD Father  Hypertension Sister    Hypertension Brother     Social History   Socioeconomic History   Marital status: Married    Spouse name: Not on file   Number of children: Not on file   Years of education: Not on file   Highest education level: Not on file  Occupational History   Not on file  Tobacco Use   Smoking status: Former    Packs/day: 0.50    Pack years: 0.00    Types: Cigarettes    Quit date: 09/2017    Years since quitting: 3.4   Smokeless tobacco: Never  Substance and Sexual Activity   Alcohol use: Yes    Comment: twice monthly   Drug use: Never   Sexual activity: Not on file  Other Topics Concern   Not on file   Social History Narrative   Not on file   Social Determinants of Health   Financial Resource Strain: Not on file  Food Insecurity: Not on file  Transportation Needs: Not on file  Physical Activity: Not on file  Stress: Not on file  Social Connections: Not on file  Intimate Partner Violence: Not on file    Review of Systems  Constitutional:  Negative for fatigue.  Respiratory:  Negative for cough and shortness of breath.   Gastrointestinal: Negative.   Endocrine: Negative.   Musculoskeletal: Negative.   Psychiatric/Behavioral:  Negative for sleep disturbance.    Vitals:   03/03/21 1014  BP: 118/74  Pulse: 61  Temp: 97.6 F (36.4 C)  SpO2: 99%     Physical Exam Constitutional:      Appearance: She is obese.  HENT:     Head: Normocephalic and atraumatic.  Eyes:     General:        Right eye: No discharge.        Left eye: No discharge.  Cardiovascular:     Rate and Rhythm: Normal rate and regular rhythm.     Pulses: Normal pulses.     Heart sounds: No murmur heard.   No friction rub.  Pulmonary:     Effort: No respiratory distress.     Breath sounds: No stridor. No wheezing or rhonchi.  Musculoskeletal:     Cervical back: No rigidity or tenderness.  Neurological:     Mental Status: She is alert.  Psychiatric:        Mood and Affect: Mood normal.   Data Reviewed: Most recent CT 02/27/2021 reviewed showing resolution of previously noted 6 mm left upper lobe nodule  Pulmonary function test within normal limits with no obstruction, no restriction, normal diffusing capacity  Assessment:  Lung nodule -Resolved -No significant other abnormality noted  History of coronary artery disease  Reformed smoker  Plan/Recommendations:  Routine follow-up  Will see patient as needed  Encouraged to call with any significant concerns  No need for repeat CT scans of the chest   Virl Diamond MD [facility] 03/03/2021, 10:19 AM  CC:  Blane Ohara, MD

## 2021-03-03 NOTE — Progress Notes (Signed)
Full PFT completed today ? ?

## 2021-05-12 ENCOUNTER — Ambulatory Visit (INDEPENDENT_AMBULATORY_CARE_PROVIDER_SITE_OTHER): Payer: BC Managed Care – PPO | Admitting: Nurse Practitioner

## 2021-05-12 ENCOUNTER — Encounter: Payer: Self-pay | Admitting: Nurse Practitioner

## 2021-05-12 VITALS — Ht 63.0 in | Wt 199.0 lb

## 2021-05-12 DIAGNOSIS — R059 Cough, unspecified: Secondary | ICD-10-CM

## 2021-05-12 DIAGNOSIS — U071 COVID-19: Secondary | ICD-10-CM

## 2021-05-12 DIAGNOSIS — J029 Acute pharyngitis, unspecified: Secondary | ICD-10-CM

## 2021-05-12 DIAGNOSIS — Z20822 Contact with and (suspected) exposure to covid-19: Secondary | ICD-10-CM

## 2021-05-12 LAB — POC COVID19 BINAXNOW: SARS Coronavirus 2 Ag: POSITIVE — AB

## 2021-05-12 LAB — POCT RAPID STREP A (OFFICE): Rapid Strep A Screen: NEGATIVE

## 2021-05-12 MED ORDER — MOLNUPIRAVIR EUA 200MG CAPSULE: 4.0000 | ORAL_CAPSULE | Freq: Two times a day (BID) | ORAL | 0 refills | Status: AC

## 2021-05-12 MED ORDER — PROMETHAZINE-DM 6.25-15 MG/5ML PO SYRP: 5.0000 mL | ORAL_SOLUTION | Freq: Four times a day (QID) | ORAL | 0 refills | Status: DC | PRN
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
## 2021-05-17 ENCOUNTER — Encounter: Payer: Self-pay | Admitting: Family Medicine

## 2021-05-17 ENCOUNTER — Ambulatory Visit (INDEPENDENT_AMBULATORY_CARE_PROVIDER_SITE_OTHER): Payer: BC Managed Care – PPO | Admitting: Family Medicine

## 2021-05-17 ENCOUNTER — Telehealth: Payer: Self-pay

## 2021-05-17 VITALS — BP 131/70 | HR 62 | Temp 97.8°F | Resp 14

## 2021-05-17 DIAGNOSIS — J180 Bronchopneumonia, unspecified organism: Secondary | ICD-10-CM

## 2021-05-17 DIAGNOSIS — J208 Acute bronchitis due to other specified organisms: Secondary | ICD-10-CM | POA: Diagnosis not present

## 2021-05-17 DIAGNOSIS — U071 COVID-19: Secondary | ICD-10-CM | POA: Diagnosis not present

## 2021-05-17 DIAGNOSIS — I10 Essential (primary) hypertension: Secondary | ICD-10-CM

## 2021-05-17 MED ORDER — AMOXICILLIN-POT CLAVULANATE 875-125 MG PO TABS: 1.0000 | ORAL_TABLET | Freq: Two times a day (BID) | ORAL | 0 refills | Status: DC

## 2021-05-17 MED ORDER — TRIAMCINOLONE ACETONIDE 40 MG/ML IJ SUSP
80.0000 mg | Freq: Once | INTRAMUSCULAR | Status: AC
  Administered 2021-05-17: 80 mg via INTRAMUSCULAR

## 2021-05-17 MED ORDER — CHLORTHALIDONE 25 MG PO TABS: 12.5000 mg | ORAL_TABLET | Freq: Every day | ORAL | 0 refills | Status: DC

## 2021-05-17 MED ORDER — CEFTRIAXONE SODIUM 1 G IJ SOLR
1.0000 g | Freq: Once | INTRAMUSCULAR | Status: AC
  Administered 2021-05-17: 1 g via INTRAMUSCULAR

## 2021-05-17 NOTE — Assessment & Plan Note (Signed)
Continue lisinopril 40 mg one twice a day.  Continue carvedilol 12.5 mg one twice a day.  Continue chlorthalidone 25 mg 1/2 daily.

## 2021-05-17 NOTE — Telephone Encounter (Signed)
Pt calling states she is "getting over COVID". She is finishing antiviral medications. She has been having high blood pressure and does not feel the antivirals have helped. Blood pressure has been in the 150s and she has been taking previous medications to combat this. She is concerned she may have pneumonia due to chest congestion. She is on day 5 of COVID. Positive on 9/9. Made pt aware we would need to see her to evaluate her and pt only wanted message sent to Dr Sedalia Muta for her advisement.   Lorita Officer, [place] 05/17/21 10:56 AM

## 2021-05-17 NOTE — Progress Notes (Signed)
Acute Office Visit  Subjective:    Patient ID: Caroline Kelly, female    DOB: 12/25/1960, 60 y.o.   MRN: [id number]  Chief Complaint  Patient presents with   covid 38     HPI Patient is in today concerned about pneumonia. Pt is know to get peumonia. She is day 7 of sxs of covid 19. Complaining of cough, chest congestion, and wheezing. No fever. She has been taking mucinex dm and azelastine. She has albuterol hfa at home.   Pt has also been having issues with her bp creeping up. She was down to lisinopril 40 mg one twice a day. In the last week she has had to restart carvedilol 12.5 mg one twice a day and chlorthalidone 25 mg 1/2 pill daily. This morning her bp was 130/77.  Past Medical History:  Diagnosis Date   Allergic rhinitis    Allergy    GERD (gastroesophageal reflux disease)    Hyperlipidemia    Hypertension     Past Surgical History:  Procedure Laterality Date   CESAREAN SECTION     ENDOMETRIAL ABLATION  1989    Family History  Problem Relation Age of Onset   Heart failure Mother    Dementia Mother    Hyperlipidemia Father    Hypertension Father    CAD Father    Hypertension Sister    Hypertension Brother     Social History   Socioeconomic History   Marital status: Married    Spouse name: Not on file   Number of children: Not on file   Years of education: Not on file   Highest education level: Not on file  Occupational History   Not on file  Tobacco Use   Smoking status: Former    Packs/day: 0.50    Types: Cigarettes    Quit date: 09/2017    Years since quitting: 3.7   Smokeless tobacco: Never  Substance and Sexual Activity   Alcohol use: Yes    Comment: twice monthly   Drug use: Never   Sexual activity: Not on file  Other Topics Concern   Not on file  Social History Narrative   Not on file   Social Determinants of Health   Financial Resource Strain: Not on file  Food Insecurity: Not on file  Transportation Needs: Not on file   Physical Activity: Not on file  Stress: Not on file  Social Connections: Not on file  Intimate Partner Violence: Not on file    Outpatient Medications Prior to Visit  Medication Sig Dispense Refill   albuterol (VENTOLIN HFA) 108 (90 Base) MCG/ACT inhaler Inhale 2 puffs into the lungs every 6 (six) hours as needed for wheezing or shortness of breath. 8 g 0   Ascorbic Acid (VITAMIN C PO) Take 1 tablet by mouth daily.     aspirin EC 81 MG tablet Take 1 tablet (81 mg total) by mouth daily. Swallow whole. 30 tablet 11   atorvastatin (LIPITOR) 40 MG tablet Take 1 tablet (40 mg total) by mouth daily. 90 tablet 0   calcium-vitamin D (OSCAL WITH D) 500-200 MG-UNIT tablet Take 1 tablet by mouth daily with breakfast.     carvedilol (COREG) 12.5 MG tablet Take 12.5 mg by mouth 2 (two) times daily.     Coenzyme Q10 (CO Q 10 PO) Take 1 tablet by mouth daily at 6 (six) AM.     levothyroxine (SYNTHROID) 25 MCG tablet TAKE ONE TABLET BY MOUTH ONCE DAILY 90 tablet  3   lisinopril (ZESTRIL) 40 MG tablet TAKE 1 TABLET BY MOUTH TWICE DAILY. 180 tablet 0   loratadine (CLARITIN) 10 MG tablet Take 1 tablet (10 mg total) by mouth daily. 90 tablet 1   molnupiravir EUA 200 mg CAPS Take 4 capsules (800 mg total) by mouth 2 (two) times daily for 5 days. 40 capsule 0   nitroGLYCERIN (NITROSTAT) 0.4 MG SL tablet Place 1 tablet (0.4 mg total) under the tongue every 5 (five) minutes as needed for chest pain. 25 tablet 3   omeprazole (PRILOSEC) 40 MG capsule TAKE 1 CAPSULE BY MOUTH ONCE DAILY. 90 capsule 3   Probiotic Product (PROBIOTIC-10 PO) Take 1 tablet by mouth daily.     promethazine-dextromethorphan (PROMETHAZINE-DM) 6.25-15 MG/5ML syrup Take 5 mLs by mouth 4 (four) times daily as needed. 118 mL 0   No facility-administered medications prior to visit.    Allergies  Allergen Reactions   Levofloxacin Hives   Latex Rash    Review of Systems  Constitutional:  Negative for chills, fatigue and fever.  HENT:   Negative for congestion, ear pain and sore throat.   Respiratory:  Positive for cough, shortness of breath and wheezing.   Cardiovascular:  Negative for chest pain.      Objective:    Physical Exam Vitals reviewed.  Constitutional:      Appearance: Normal appearance.  Cardiovascular:     Rate and Rhythm: Normal rate and regular rhythm.     Heart sounds: Normal heart sounds. No murmur heard. Pulmonary:     Effort: Pulmonary effort is normal. No respiratory distress.     Breath sounds: Wheezing and rhonchi present.  Neurological:     Mental Status: She is alert and oriented to person, place, and time.  Psychiatric:        Mood and Affect: Mood normal.        Behavior: Behavior normal.    BP 131/70   Pulse 62   Temp 97.8 F (36.6 C)   Resp 14   SpO2 96%  Wt Readings from Last 3 Encounters:  05/12/21 199 lb (90.3 kg)  03/03/21 199 lb (90.3 kg)  02/16/21 195 lb (88.5 kg)    Health Maintenance Due  Topic Date Due   HIV Screening  Never done   Hepatitis C Screening  Never done   PAP SMEAR-Modifier  Never done   COLONOSCOPY (Pts 45-98yr Insurance coverage will need to be confirmed)  Never done   TETANUS/TDAP  09/03/2020   COVID-19 Vaccine (4 - Booster for Pfizer series) 10/17/2020   INFLUENZA VACCINE  04/03/2021    There are no preventive care reminders to display for this patient.   Lab Results  Component Value Date   TSH 2.200 08/10/2020   Lab Results  Component Value Date   WBC 6.1 02/15/2021   HGB 13.9 02/15/2021   HCT 42.8 02/15/2021   MCV 92 02/15/2021   PLT 295 02/15/2021   Lab Results  Component Value Date   NA 138 02/15/2021   K 4.7 02/15/2021   CO2 25 02/15/2021   GLUCOSE 87 02/15/2021   BUN 19 02/15/2021   CREATININE 1.09 (H) 02/15/2021   BILITOT 0.5 02/15/2021   ALKPHOS 79 02/15/2021   AST 29 02/15/2021   ALT 34 (H) 02/15/2021   PROT 6.8 02/15/2021   ALBUMIN 4.5 02/15/2021   CALCIUM 9.7 02/15/2021   EGFR 59 (L) 02/15/2021   Lab  Results  Component Value Date   CHOL 238 (H)  02/15/2021   Lab Results  Component Value Date   HDL 60 02/15/2021   Lab Results  Component Value Date   LDLCALC 161 (H) 02/15/2021   Lab Results  Component Value Date   TRIG 99 02/15/2021   Lab Results  Component Value Date   CHOLHDL 4.0 02/15/2021   No results found for: HGBA1C     Assessment & Plan:   Problem List Items Addressed This Visit       Cardiovascular and Mediastinum   Essential hypertension, benign    Continue lisinopril 40 mg one twice a day.  Continue carvedilol 12.5 mg one twice a day.  Continue chlorthalidone 25 mg 1/2 daily.       Relevant Medications   carvedilol (COREG) 12.5 MG tablet   Other Visit Diagnoses     Acute bronchitis due to COVID-19 virus    -  Primary   Relevant Medications   cefTRIAXone (ROCEPHIN) injection 1 g (Start on 05/17/2021  3:30 PM)   Bronchial pneumonia       Relevant Medications   amoxicillin-clavulanate (AUGMENTIN) 875-125 MG tablet   triamcinolone acetonide (KENALOG-40) injection 80 mg (Start on 05/17/2021  3:30 PM)   cefTRIAXone (ROCEPHIN) injection 1 g (Start on 05/17/2021  3:30 PM)        Meds ordered this encounter  Medications   amoxicillin-clavulanate (AUGMENTIN) 875-125 MG tablet    Sig: Take 1 tablet by mouth 2 (two) times daily.    Dispense:  20 tablet    Refill:  0   triamcinolone acetonide (KENALOG-40) injection 80 mg   cefTRIAXone (ROCEPHIN) injection 1 g   Follow-up: Return if symptoms worsen or fail to improve.  An After Visit Summary was printed and given to the patient.  Rochel Brome, MD [facility] [phone]

## 2021-05-17 NOTE — Telephone Encounter (Signed)
Spoke w/ provider. Provider advises to see pt this afternoon.   Spoke w/ pt. Pt will come to office @ 2:45.   Lorita Officer, [place] 05/17/21 1:57 PM

## 2021-05-27 ENCOUNTER — Other Ambulatory Visit: Payer: Self-pay | Admitting: Nurse Practitioner

## 2021-05-27 ENCOUNTER — Other Ambulatory Visit: Payer: Self-pay | Admitting: Physician Assistant

## 2021-05-27 DIAGNOSIS — J309 Allergic rhinitis, unspecified: Secondary | ICD-10-CM

## 2021-05-27 DIAGNOSIS — J0100 Acute maxillary sinusitis, unspecified: Secondary | ICD-10-CM

## 2021-06-08 ENCOUNTER — Encounter: Payer: Self-pay | Admitting: Nurse Practitioner

## 2021-06-08 ENCOUNTER — Ambulatory Visit (INDEPENDENT_AMBULATORY_CARE_PROVIDER_SITE_OTHER): Payer: BC Managed Care – PPO | Admitting: Nurse Practitioner

## 2021-06-08 ENCOUNTER — Other Ambulatory Visit: Payer: Self-pay

## 2021-06-08 VITALS — BP 138/78 | HR 86 | Temp 97.6°F | Ht 63.0 in | Wt 198.0 lb

## 2021-06-08 DIAGNOSIS — L237 Allergic contact dermatitis due to plants, except food: Secondary | ICD-10-CM

## 2021-06-08 DIAGNOSIS — H60503 Unspecified acute noninfective otitis externa, bilateral: Secondary | ICD-10-CM

## 2021-06-08 MED ORDER — NEOMYCIN-POLYMYXIN-HC 3.5-10000-1 OT SOLN: 4.0000 [drp] | Freq: Four times a day (QID) | OTIC | 0 refills | Status: DC

## 2021-06-08 MED ORDER — TRIAMCINOLONE ACETONIDE 40 MG/ML IJ SUSP
60.0000 mg | Freq: Once | INTRAMUSCULAR | Status: AC
  Administered 2021-06-08: 60 mg via INTRAMUSCULAR

## 2021-06-08 NOTE — Progress Notes (Signed)
Acute Office Visit  Subjective:    Patient ID: Caroline Kelly, female    DOB: [birth year], 60 y.o.   MRN: [id number]  Chief Complaint  Patient presents with  . Dermatitis     HPI Caroline Kelly is in today for poison ivy dermatitis after gardening. Onset was four days ago. Treatment has included calamine lotion, showered with a scrub, black salve and topical spray, has applied benadryl gel with little relief.  [name] states she has continued to experience bilateral ear discomfort. She was recently treated with a course of Augmentin for an URI on 05/17/21.       Past Medical History:  Diagnosis Date  . Allergic rhinitis   . Allergy   . GERD (gastroesophageal reflux disease)   . Hyperlipidemia   . Hypertension     Past Surgical History:  Procedure Laterality Date  . CESAREAN SECTION    . ENDOMETRIAL ABLATION  1989    Family History  Problem Relation Age of Onset  . Heart failure Mother   . Dementia Mother   . Hyperlipidemia Father   . Hypertension Father   . CAD Father   . Hypertension Sister   . Hypertension Brother     Social History   Socioeconomic History  . Marital status: Married    Spouse name: Not on file  . Number of children: Not on file  . Years of education: Not on file  . Highest education level: Not on file  Occupational History  . Not on file  Tobacco Use  . Smoking status: Former    Packs/day: 0.50    Types: Cigarettes    Quit date: 09/2017    Years since quitting: 3.7  . Smokeless tobacco: Never  Substance and Sexual Activity  . Alcohol use: Yes    Comment: twice monthly  . Drug use: Never  . Sexual activity: Not on file  Other Topics Concern  . Not on file  Social History Narrative  . Not on file   Social Determinants of Health   Financial Resource Strain: Not on file  Food Insecurity: Not on file  Transportation Needs: Not on file  Physical Activity: Not on file  Stress: Not on file  Social Connections: Not on file   Intimate Partner Violence: Not on file    Outpatient Medications Prior to Visit  Medication Sig Dispense Refill  . albuterol (VENTOLIN HFA) 108 (90 Base) MCG/ACT inhaler Inhale 2 puffs into the lungs every 6 (six) hours as needed for wheezing or shortness of breath. 8 g 0  . ALLERGY RELIEF 10 MG tablet TAKE ONE TABLET BY MOUTH EVERY DAY 90 tablet 1  . amoxicillin-clavulanate (AUGMENTIN) 875-125 MG tablet Take 1 tablet by mouth 2 (two) times daily. 20 tablet 0  . Ascorbic Acid (VITAMIN C PO) Take 1 tablet by mouth daily.    Marland Kitchen aspirin EC 81 MG tablet Take 1 tablet (81 mg total) by mouth daily. Swallow whole. 30 tablet 11  . atorvastatin (LIPITOR) 40 MG tablet Take 1 tablet (40 mg total) by mouth daily. 90 tablet 0  . calcium-vitamin D (OSCAL WITH D) 500-200 MG-UNIT tablet Take 1 tablet by mouth daily with breakfast.    . carvedilol (COREG) 12.5 MG tablet Take 12.5 mg by mouth 2 (two) times daily.    . chlorthalidone (HYGROTON) 25 MG tablet Take 0.5 tablets (12.5 mg total) by mouth daily. 1 tablet 0  . Coenzyme Q10 (CO Q 10 PO) Take 1 tablet by mouth daily  at 6 (six) AM.    . levothyroxine (SYNTHROID) 25 MCG tablet TAKE ONE TABLET BY MOUTH ONCE DAILY 90 tablet 3  . lisinopril (ZESTRIL) 40 MG tablet TAKE 1 TABLET BY MOUTH TWICE DAILY. 180 tablet 0  . nitroGLYCERIN (NITROSTAT) 0.4 MG SL tablet Place 1 tablet (0.4 mg total) under the tongue every 5 (five) minutes as needed for chest pain. 25 tablet 3  . omeprazole (PRILOSEC) 40 MG capsule TAKE 1 CAPSULE BY MOUTH ONCE DAILY. 90 capsule 3  . Probiotic Product (PROBIOTIC-10 PO) Take 1 tablet by mouth daily.    . promethazine-dextromethorphan (PROMETHAZINE-DM) 6.25-15 MG/5ML syrup Take 5 mLs by mouth 4 (four) times daily as needed. 118 mL 0   No facility-administered medications prior to visit.    Allergies  Allergen Reactions  . Levofloxacin Hives  . Latex Rash    Review of Systems  Constitutional:  Negative for chills, fatigue and fever.   HENT:  Negative for congestion, ear pain, rhinorrhea and sore throat.   Respiratory:  Negative for cough and shortness of breath.   Cardiovascular:  Negative for chest pain.  Gastrointestinal:  Negative for abdominal pain, constipation, diarrhea, nausea and vomiting.  Genitourinary:  Negative for dysuria and urgency.  Musculoskeletal:  Negative for back pain and myalgias.  Neurological:  Negative for dizziness, weakness, light-headedness and headaches.  Psychiatric/Behavioral:  Negative for dysphoric mood. The patient is not nervous/anxious.       Objective:    Physical Exam Vitals reviewed.  HENT:     Right Ear: Tenderness present.     Left Ear: Tenderness present.     Ears:     Comments: Erythema and tenderness to bilateral ear canals Skin:    General: Skin is warm and dry.     Capillary Refill: Capillary refill takes less than 2 seconds.     Findings: Rash present. Rash is papular.          Comments: Erythematous papular rash to torso, bilateral upper and lower extremities  Neurological:     General: No focal deficit present.     Mental Status: She is alert and oriented to person, place, and time.   BP 138/78   Pulse 86   Temp 97.6 F (36.4 C)   Ht 5' 3"  (1.6 m)   Wt 198 lb (89.8 kg)   SpO2 95%   BMI 35.07 kg/m   Wt Readings from Last 3 Encounters:  05/12/21 199 lb (90.3 kg)  03/03/21 199 lb (90.3 kg)  02/16/21 195 lb (88.5 kg)    Health Maintenance Due  Topic Date Due  . HIV Screening  Never done  . Hepatitis C Screening  Never done  . PAP SMEAR-Modifier  Never done  . COLONOSCOPY (Pts 45-21yr Insurance coverage will need to be confirmed)  Never done  . TETANUS/TDAP  09/03/2020  . COVID-19 Vaccine (4 - Booster for Pfizer series) 10/17/2020  . INFLUENZA VACCINE  04/03/2021       Lab Results  Component Value Date   TSH 2.200 08/10/2020   Lab Results  Component Value Date   WBC 6.1 02/15/2021   HGB 13.9 02/15/2021   HCT 42.8 02/15/2021   MCV 92  02/15/2021   PLT 295 02/15/2021   Lab Results  Component Value Date   NA 138 02/15/2021   K 4.7 02/15/2021   CO2 25 02/15/2021   GLUCOSE 87 02/15/2021   BUN 19 02/15/2021   CREATININE 1.09 (H) 02/15/2021   BILITOT 0.5 02/15/2021  ALKPHOS 79 02/15/2021   AST 29 02/15/2021   ALT 34 (H) 02/15/2021   PROT 6.8 02/15/2021   ALBUMIN 4.5 02/15/2021   CALCIUM 9.7 02/15/2021   EGFR 59 (L) 02/15/2021   Lab Results  Component Value Date   CHOL 238 (H) 02/15/2021   Lab Results  Component Value Date   HDL 60 02/15/2021   Lab Results  Component Value Date   LDLCALC 161 (H) 02/15/2021   Lab Results  Component Value Date   TRIG 99 02/15/2021   Lab Results  Component Value Date   CHOLHDL 4.0 02/15/2021        Assessment & Plan:    1. Poison ivy dermatitis - triamcinolone acetonide (KENALOG-40) injection 60 mg  2. Acute otitis externa of both ears, unspecified type - neomycin-polymyxin-hydrocortisone (CORTISPORIN) OTIC solution; Place 4 drops into both ears 4 (four) times daily.  Dispense: 10 mL; Refill: 0   Kenalog injection given for poison ivy rash May continue Benadryl and topical ointment for itch relief Instill 4 drops antibiotic ear drops to bilateral ears four times daily Follow-up as needed    Follow-up: PRN  An After Visit Summary was printed and given to the patient.  Rip Harbour, NP [place] [phone]

## 2021-06-08 NOTE — Patient Instructions (Addendum)
Kenalog injection given for poison ivy rash May continue Benadryl and topical ointment for itch relief Instill 4 drops antibiotic ear drops to bilateral ears four times daily Follow-up as needed  Poison Ivy Dermatitis Poison ivy dermatitis is redness and soreness of the skin caused by chemicals in the leaves of the poison ivy plant. You may have very bad itching, swelling, a rash, and blisters. What are the causes? Touching a poison ivy plant. Touching something that has the chemical on it. This may include animals or objects that have come in contact with the plant. What increases the risk? Going outdoors often in wooded or [place] areas. Going outdoors without wearing protective clothing, such as closed shoes, long pants, and a long-sleeved shirt. What are the signs or symptoms?  Skin redness. Very bad itching. A rash that often includes bumps and blisters. The rash usually appears 48 hours after exposure, if you have been exposed before. If this is the first time you have been exposed, the rash may not appear until a week after exposure. Swelling. This may occur if the reaction is very bad. Symptoms usually last for 1-2 weeks. The first time you develop this condition, symptoms may last 3-4 weeks. How is this treated? This condition may be treated with: Hydrocortisone cream or calamine lotion to relieve itching. Oatmeal baths to soothe the skin. Medicines, such as over-the-counter antihistamine tablets. Oral steroid medicine for more severe reactions. Follow these instructions at home: Medicines Take or apply over-the-counter and prescription medicines only as told by your doctor. Use hydrocortisone cream or calamine lotion as needed to help with itching. General instructions Do not scratch or rub your skin. Put a cold, wet cloth (cold compress) on the affected areas or take baths in cool water. This will help with itching. Avoid hot baths and showers. Take oatmeal baths as  needed. Use colloidal oatmeal. You can get this at a pharmacy or grocery store. Follow the instructions on the package. While you have the rash, wash your clothes right after you wear them. Keep all follow-up visits as told by your health care provider. This is important. How is this prevented?  Know what poison ivy looks like, so you can avoid it. This plant has three leaves with flowering branches on a single stem. The leaves are glossy. The leaves have uneven edges that come to a point at the front. If you touch poison ivy, wash your skin with soap and water right away. Be sure to wash under your fingernails. When hiking or camping, wear long pants, a long-sleeved shirt, tall socks, and hiking boots. You can also use a lotion on your skin that helps to prevent contact with poison ivy. If you think that your clothes or outdoor gear came in contact with poison ivy, rinse them off with a garden hose before you bring them inside your house. When doing yard work or gardening, wear gloves, long sleeves, long pants, and boots. Wash your garden tools and gloves if they come in contact with poison ivy. If you think that your pet has come into contact with poison ivy, wash him or her with pet shampoo and water. Make sure to wear gloves while washing your pet. Contact a doctor if: You have open sores in the rash area. You have more redness, swelling, or pain in the rash area. You have redness that spreads beyond the rash area. You have fluid, blood, or pus coming from the rash area. You have a fever. You have a  rash over a large area of your body. You have a rash on your eyes, mouth, or genitals. Your rash does not get better after a few weeks. Get help right away if: Your face swells or your eyes swell shut. You have trouble breathing. You have trouble swallowing. These symptoms may be an emergency. Do not wait to see if the symptoms will go away. Get medical help right away. Call your local  emergency services (911 in the [place] Do not drive yourself to the hospital. Summary Poison ivy dermatitis is redness and soreness of the skin caused by chemicals in the leaves of the poison ivy plant. You may have skin redness, very bad itching, swelling, and a rash. Do not scratch or rub your skin. Take or apply over-the-counter and prescription medicines only as told by your doctor. This information is not intended to replace advice given to you by your health care provider. Make sure you discuss any questions you have with your health care provider. Document Revised: 12/12/2018 Document Reviewed: 08/15/2018 Elsevier Patient Education  2022 [employer].

## 2021-06-21 ENCOUNTER — Ambulatory Visit: Payer: BC Managed Care – PPO | Admitting: Family Medicine

## 2021-06-21 ENCOUNTER — Encounter: Payer: Self-pay | Admitting: Family Medicine

## 2021-06-21 ENCOUNTER — Ambulatory Visit (INDEPENDENT_AMBULATORY_CARE_PROVIDER_SITE_OTHER): Payer: BC Managed Care – PPO

## 2021-06-21 DIAGNOSIS — Z23 Encounter for immunization: Secondary | ICD-10-CM

## 2021-06-21 DIAGNOSIS — I10 Essential (primary) hypertension: Secondary | ICD-10-CM

## 2021-06-21 DIAGNOSIS — K219 Gastro-esophageal reflux disease without esophagitis: Secondary | ICD-10-CM

## 2021-06-21 DIAGNOSIS — E782 Mixed hyperlipidemia: Secondary | ICD-10-CM

## 2021-06-21 DIAGNOSIS — E039 Hypothyroidism, unspecified: Secondary | ICD-10-CM

## 2021-07-05 ENCOUNTER — Encounter: Payer: Self-pay | Admitting: Family Medicine

## 2021-07-05 ENCOUNTER — Ambulatory Visit: Payer: BC Managed Care – PPO | Admitting: Family Medicine

## 2021-07-05 VITALS — BP 134/78 | HR 64 | Temp 96.0°F | Resp 16 | Ht 63.0 in | Wt 189.0 lb

## 2021-07-05 DIAGNOSIS — E6609 Other obesity due to excess calories: Secondary | ICD-10-CM

## 2021-07-05 DIAGNOSIS — Z23 Encounter for immunization: Secondary | ICD-10-CM

## 2021-07-05 DIAGNOSIS — E039 Hypothyroidism, unspecified: Secondary | ICD-10-CM

## 2021-07-05 DIAGNOSIS — Z6833 Body mass index (BMI) 33.0-33.9, adult: Secondary | ICD-10-CM

## 2021-07-05 DIAGNOSIS — R7309 Other abnormal glucose: Secondary | ICD-10-CM | POA: Diagnosis not present

## 2021-07-05 DIAGNOSIS — R051 Acute cough: Secondary | ICD-10-CM

## 2021-07-05 DIAGNOSIS — E782 Mixed hyperlipidemia: Secondary | ICD-10-CM

## 2021-07-05 DIAGNOSIS — I10 Essential (primary) hypertension: Secondary | ICD-10-CM | POA: Diagnosis not present

## 2021-07-05 DIAGNOSIS — K219 Gastro-esophageal reflux disease without esophagitis: Secondary | ICD-10-CM

## 2021-07-05 LAB — POC COVID19 BINAXNOW: SARS Coronavirus 2 Ag: NEGATIVE

## 2021-07-05 NOTE — Assessment & Plan Note (Signed)
Well-controlled.  No changes to medications. Low-salt diet and exercise continue to be recommended.

## 2021-07-05 NOTE — Progress Notes (Signed)
Subjective:  Patient ID: [name], female    DOB: 25-Apr-1961  Age: 60 y.o. MRN: [id number]  Chief Complaint  Patient presents with   Hypertension   Hyperlipidemia    HPI Hypertension: Lisinopril 40 mg bid.  Hypothyroidism: on levothyroxine 25 mcg once daily in am.  Hyperlipidemia: Atorvastatin 40 mg once daily Ran out one month ago.  RSV went through her grandchildren. Over this past weekend she had congestion, drainage, and cough. Little wheezing. Negative covid 19 and flu tests prior to coming in the office  Her father in law passed recently.  Current Outpatient Medications on File Prior to Visit  Medication Sig Dispense Refill   B Complex Vitamins (VITAMIN B COMPLEX PO) Take by mouth.     albuterol (VENTOLIN HFA) 108 (90 Base) MCG/ACT inhaler Inhale 2 puffs into the lungs every 6 (six) hours as needed for wheezing or shortness of breath. 8 g 0   ALLERGY RELIEF 10 MG tablet TAKE ONE TABLET BY MOUTH EVERY DAY 90 tablet 1   aspirin EC 81 MG tablet Take 1 tablet (81 mg total) by mouth daily. Swallow whole. 30 tablet 11   atorvastatin (LIPITOR) 40 MG tablet Take 1 tablet (40 mg total) by mouth daily. 90 tablet 0   calcium-vitamin D (OSCAL WITH D) 500-200 MG-UNIT tablet Take 1 tablet by mouth daily with breakfast.     Coenzyme Q10 (CO Q 10 PO) Take 1 tablet by mouth daily at 6 (six) AM.     levothyroxine (SYNTHROID) 25 MCG tablet TAKE ONE TABLET BY MOUTH ONCE DAILY 90 tablet 3   lisinopril (ZESTRIL) 40 MG tablet TAKE 1 TABLET BY MOUTH TWICE DAILY. 180 tablet 0   neomycin-polymyxin-hydrocortisone (CORTISPORIN) OTIC solution Place 4 drops into both ears 4 (four) times daily. 10 mL 0   nitroGLYCERIN (NITROSTAT) 0.4 MG SL tablet Place 1 tablet (0.4 mg total) under the tongue every 5 (five) minutes as needed for chest pain. 25 tablet 3   omeprazole (PRILOSEC) 40 MG capsule TAKE 1 CAPSULE BY MOUTH ONCE DAILY. 90 capsule 3   Probiotic Product (PROBIOTIC-10 PO) Take 1 tablet by mouth  daily.     promethazine-dextromethorphan (PROMETHAZINE-DM) 6.25-15 MG/5ML syrup Take 5 mLs by mouth 4 (four) times daily as needed. 118 mL 0   No current facility-administered medications on file prior to visit.   Past Medical History:  Diagnosis Date   Allergic rhinitis    Allergy    GERD (gastroesophageal reflux disease)    Hyperlipidemia    Hypertension    Past Surgical History:  Procedure Laterality Date   CESAREAN SECTION     ENDOMETRIAL ABLATION  1989    Family History  Problem Relation Age of Onset   Heart failure Mother    Dementia Mother    Hyperlipidemia Father    Hypertension Father    CAD Father    Hypertension Sister    Hypertension Brother    Social History   Socioeconomic History   Marital status: Married    Spouse name: Not on file   Number of children: Not on file   Years of education: Not on file   Highest education level: Not on file  Occupational History   Not on file  Tobacco Use   Smoking status: Former    Packs/day: 0.50    Types: Cigarettes    Quit date: 09/2017    Years since quitting: 3.8   Smokeless tobacco: Never  Substance and Sexual Activity   Alcohol  use: Yes    Comment: twice monthly   Drug use: Never   Sexual activity: Not on file  Other Topics Concern   Not on file  Social History Narrative   Not on file   Social Determinants of Health   Financial Resource Strain: Not on file  Food Insecurity: Not on file  Transportation Needs: Not on file  Physical Activity: Not on file  Stress: Not on file  Social Connections: Not on file    Review of Systems  Constitutional:  Positive for fatigue. Negative for chills and fever.  HENT:  Positive for congestion and postnasal drip. Negative for rhinorrhea and sore throat.   Respiratory:  Positive for cough and shortness of breath.   Cardiovascular:  Negative for chest pain.  Gastrointestinal:  Negative for abdominal pain, constipation, diarrhea, nausea and vomiting.   Genitourinary:  Negative for dysuria and urgency.  Musculoskeletal:  Negative for back pain and myalgias.  Neurological:  Negative for dizziness, weakness, light-headedness and headaches.  Psychiatric/Behavioral:  Negative for dysphoric mood. The patient is not nervous/anxious.     Objective:  BP 134/78   Pulse 64   Temp (!) 96 F (35.6 C)   Resp 16   Ht 5\' 3"  (1.6 m)   Wt 189 lb (85.7 kg)   BMI 33.48 kg/m   BP/Weight 07/05/2021 06/08/2021 05/17/2021  Systolic BP 134 138 131  Diastolic BP 78 78 70  Wt. (Lbs) 189 198 -  BMI 33.48 35.07 -    Physical Exam Vitals reviewed.  Constitutional:      Appearance: Normal appearance. She is normal weight.  HENT:     Right Ear: Tympanic membrane, ear canal and external ear normal.     Left Ear: Tympanic membrane, ear canal and external ear normal.     Nose: Congestion present.     Mouth/Throat:     Mouth: Mucous membranes are moist.     Pharynx: Oropharynx is clear.  Neck:     Vascular: No carotid bruit.  Cardiovascular:     Rate and Rhythm: Normal rate and regular rhythm.     Heart sounds: Normal heart sounds. No murmur heard. Pulmonary:     Effort: Pulmonary effort is normal. No respiratory distress.     Breath sounds: Normal breath sounds.  Abdominal:     General: Abdomen is flat. Bowel sounds are normal.     Palpations: Abdomen is soft.     Tenderness: There is no abdominal tenderness.  Neurological:     Mental Status: She is alert and oriented to person, place, and time.  Psychiatric:        Mood and Affect: Mood normal.        Behavior: Behavior normal.    Diabetic Foot Exam - Simple   No data filed      Lab Results  Component Value Date   WBC 6.1 02/15/2021   HGB 13.9 02/15/2021   HCT 42.8 02/15/2021   PLT 295 02/15/2021   GLUCOSE 87 02/15/2021   CHOL 238 (H) 02/15/2021   TRIG 99 02/15/2021   HDL 60 02/15/2021   LDLCALC 161 (H) 02/15/2021   ALT 34 (H) 02/15/2021   AST 29 02/15/2021   NA 138 02/15/2021    K 4.7 02/15/2021   CL 99 02/15/2021   CREATININE 1.09 (H) 02/15/2021   BUN 19 02/15/2021   CO2 25 02/15/2021   TSH 2.200 08/10/2020      Assessment & Plan:   Problem List Items  Addressed This Visit       Cardiovascular and Mediastinum   Essential hypertension, benign   Relevant Orders   CBC with Differential/Platelet   Comprehensive metabolic panel   TSH     Endocrine   Acquired hypothyroidism     Other   Mixed hyperlipidemia   Relevant Orders   Lipid panel   Other Visit Diagnoses     Acute cough    -  Primary   Relevant Orders   POC COVID-19 (Completed)   Elevated glucose       Relevant Orders   Hemoglobin A1c   Class 1 obesity due to excess calories with serious comorbidity and body mass index (BMI) of 33.0 to 33.9 in adult         .  No orders of the defined types were placed in this encounter.   Orders Placed This Encounter  Procedures   CBC with Differential/Platelet   Comprehensive metabolic panel   Hemoglobin A1c   Lipid panel   TSH   POC COVID-19     Follow-up: No follow-ups on file.  An After Visit Summary was printed and given to the patient.  Blane Ohara, MD [facility] [phone]

## 2021-07-05 NOTE — Assessment & Plan Note (Signed)
Well-controlled.  Continue omeprazole 40 mg once daily.

## 2021-07-05 NOTE — Assessment & Plan Note (Signed)
Well-controlled.  Continue current dose of Synthroid 25 mg once daily.

## 2021-07-05 NOTE — Assessment & Plan Note (Signed)
Patient ran out of her medicine approximately 1 month ago.  Anticipate her cholesterol will be elevated and she will need to restart it.  Yeah

## 2021-07-05 NOTE — Patient Instructions (Addendum)
Calcium citrate with d 1200- 1500 mg daily MVI over 50 for woman Vitamin C boost immune system.  B complex can help with energy.  Recommend continue to work on eating healthy diet and exercise.

## 2021-07-06 LAB — COMPREHENSIVE METABOLIC PANEL
ALT: 25 IU/L (ref 0–32)
AST: 26 IU/L (ref 0–40)
Albumin/Globulin Ratio: 1.8 (ref 1.2–2.2)
Albumin: 4.5 g/dL (ref 3.8–4.9)
Alkaline Phosphatase: 70 IU/L (ref 44–121)
BUN/Creatinine Ratio: 11 (ref 9–23)
BUN: 10 mg/dL (ref 6–24)
Bilirubin Total: 0.4 mg/dL (ref 0.0–1.2)
CO2: 27 mmol/L (ref 20–29)
Calcium: 10.3 mg/dL — ABNORMAL HIGH (ref 8.7–10.2)
Chloride: 102 mmol/L (ref 96–106)
Creatinine, Ser: 0.91 mg/dL (ref 0.57–1.00)
Globulin, Total: 2.5 g/dL (ref 1.5–4.5)
Glucose: 89 mg/dL (ref 70–99)
Potassium: 4.7 mmol/L (ref 3.5–5.2)
Sodium: 141 mmol/L (ref 134–144)
Total Protein: 7 g/dL (ref 6.0–8.5)
eGFR: 73 mL/min/{1.73_m2} (ref >59)

## 2021-07-06 LAB — CBC WITH DIFFERENTIAL/PLATELET
Basophils Absolute: 0.1 10*3/uL (ref 0.0–0.2)
Basos: 1 %
EOS (ABSOLUTE): 0.1 10*3/uL (ref 0.0–0.4)
Eos: 2 %
Hematocrit: 45.2 % (ref 34.0–46.6)
Hemoglobin: 15.2 g/dL (ref 11.1–15.9)
Immature Grans (Abs): 0 10*3/uL (ref 0.0–0.1)
Immature Granulocytes: 0 %
Lymphocytes Absolute: 2 10*3/uL (ref 0.7–3.1)
Lymphs: 29 %
MCH: 29.9 pg (ref 26.6–33.0)
MCHC: 33.6 g/dL (ref 31.5–35.7)
MCV: 89 fL (ref 79–97)
Monocytes Absolute: 0.7 10*3/uL (ref 0.1–0.9)
Monocytes: 10 %
Neutrophils Absolute: 4 10*3/uL (ref 1.4–7.0)
Neutrophils: 58 %
Platelets: 284 10*3/uL (ref 150–450)
RBC: 5.09 x10E6/uL (ref 3.77–5.28)
RDW: 13.7 % (ref 11.7–15.4)
WBC: 6.9 10*3/uL (ref 3.4–10.8)

## 2021-07-06 LAB — HEMOGLOBIN A1C
Est. average glucose Bld gHb Est-mCnc: 111 mg/dL
Hgb A1c MFr Bld: 5.5 % (ref 4.8–5.6)

## 2021-07-06 LAB — LIPID PANEL
Chol/HDL Ratio: 4.2 ratio (ref 0.0–4.4)
Cholesterol, Total: 306 mg/dL — ABNORMAL HIGH (ref 100–199)
HDL: 73 mg/dL (ref >39)
LDL Chol Calc (NIH): 213 mg/dL — ABNORMAL HIGH (ref 0–99)
Triglycerides: 113 mg/dL (ref 0–149)
VLDL Cholesterol Cal: 20 mg/dL (ref 5–40)

## 2021-07-06 LAB — TSH: TSH: 1.99 u[IU]/mL (ref 0.450–4.500)

## 2021-07-06 LAB — CARDIOVASCULAR RISK ASSESSMENT

## 2021-07-06 NOTE — Progress Notes (Signed)
Blood count normal.  Liver function normal.  Kidney function normal.  Cholesterol: LDL very high. At 213. Recommend restart lipitor 40 mg daily with coenzyme q 10. Recheck in 90 days without running out so we can see a true picture of how the medicine helps. Send rx if pt agrees. If she does not want to take lipitor, we need to try something else. Please review all cholesterol medicine she has tried before. HBA1C: 5.5.

## 2021-07-07 ENCOUNTER — Other Ambulatory Visit: Payer: Self-pay | Admitting: Family Medicine

## 2021-07-10 ENCOUNTER — Other Ambulatory Visit: Payer: Self-pay

## 2021-07-10 MED ORDER — ATORVASTATIN CALCIUM 40 MG PO TABS: 40.0000 mg | ORAL_TABLET | Freq: Every day | ORAL | 0 refills | Status: DC

## 2021-08-23 NOTE — Progress Notes (Signed)
Subjective:  Patient ID: [name], female    DOB: 1960-10-01  Age: 60 y.o. MRN: [id number]  Chief Complaint  Patient presents with   Hypertension    Blood pressure issues    Hypertension: On lisinopril 40 mg twice a day. Pt's bp is high in am usually. SBP 150s.  SBP 190s for a few days when her husband left her earlier this month. When she feels bad she checks her bp.  This past Saturday her bp felt low. She felt dizzy. Feels like limbs are heavy. BP 98/55. That was the first time it has happened.     Current Outpatient Medications on File Prior to Visit  Medication Sig Dispense Refill   albuterol (VENTOLIN HFA) 108 (90 Base) MCG/ACT inhaler Inhale 2 puffs into the lungs every 6 (six) hours as needed for wheezing or shortness of breath. 8 g 0   ALLERGY RELIEF 10 MG tablet TAKE ONE TABLET BY MOUTH EVERY DAY 90 tablet 1   aspirin EC 81 MG tablet Take 1 tablet (81 mg total) by mouth daily. Swallow whole. 30 tablet 11   atorvastatin (LIPITOR) 40 MG tablet Take 1 tablet (40 mg total) by mouth daily. 90 tablet 0   B Complex Vitamins (VITAMIN B COMPLEX PO) Take by mouth.     calcium-vitamin D (OSCAL WITH D) 500-200 MG-UNIT tablet Take 1 tablet by mouth daily with breakfast.     Coenzyme Q10 (CO Q 10 PO) Take 1 tablet by mouth daily at 6 (six) AM.     levothyroxine (SYNTHROID) 25 MCG tablet TAKE ONE TABLET BY MOUTH ONCE DAILY 90 tablet 3   lisinopril (ZESTRIL) 40 MG tablet TAKE 1 TABLET BY MOUTH TWICE DAILY. 180 tablet 0   nitroGLYCERIN (NITROSTAT) 0.4 MG SL tablet Place 1 tablet (0.4 mg total) under the tongue every 5 (five) minutes as needed for chest pain. 25 tablet 3   Probiotic Product (PROBIOTIC-10 PO) Take 1 tablet by mouth daily.     No current facility-administered medications on file prior to visit.   Past Medical History:  Diagnosis Date   Allergic rhinitis    Allergy    GERD (gastroesophageal reflux disease)    Hyperlipidemia    Hypertension    Past Surgical History:   Procedure Laterality Date   CESAREAN SECTION     ENDOMETRIAL ABLATION  1989    Family History  Problem Relation Age of Onset   Heart failure Mother    Dementia Mother    Hyperlipidemia Father    Hypertension Father    CAD Father    Hypertension Sister    Hypertension Brother    Social History   Socioeconomic History   Marital status: Married    Spouse name: Not on file   Number of children: Not on file   Years of education: Not on file   Highest education level: Not on file  Occupational History   Not on file  Tobacco Use   Smoking status: Former    Packs/day: 0.50    Types: Cigarettes    Quit date: 09/2017    Years since quitting: 3.9   Smokeless tobacco: Never  Substance and Sexual Activity   Alcohol use: Yes    Comment: twice monthly   Drug use: Never   Sexual activity: Not on file  Other Topics Concern   Not on file  Social History Narrative   Not on file   Social Determinants of Health   Financial Resource Strain: Not  on file  Food Insecurity: Not on file  Transportation Needs: Not on file  Physical Activity: Not on file  Stress: Not on file  Social Connections: Not on file    Review of Systems  Constitutional:  Negative for appetite change, fatigue and fever.  HENT:  Negative for congestion, ear pain, sinus pressure and sore throat.   Eyes:  Negative for pain.  Respiratory:  Negative for cough, chest tightness, shortness of breath and wheezing.   Cardiovascular:  Negative for chest pain and palpitations.  Gastrointestinal:  Negative for abdominal pain, constipation, diarrhea, nausea and vomiting.  Genitourinary:  Negative for dysuria and hematuria.  Musculoskeletal:  Negative for arthralgias, back pain, joint swelling and myalgias.  Skin:  Negative for rash.  Neurological:  Negative for dizziness, weakness and headaches.  Psychiatric/Behavioral:  Negative for dysphoric mood. The patient is not nervous/anxious.     Objective:  BP (!) 148/78 (BP  Location: Left Arm, Patient Position: Sitting, Cuff Size: Large)    Pulse 65    Temp (!) 97.4 F (36.3 C) (Temporal)    Ht 5\' 3"  (1.6 m)    Wt 189 lb (85.7 kg)    SpO2 96%    BMI 33.48 kg/m   BP/Weight 08/24/2021 07/05/2021 [id number]  Systolic BP [id number]  Diastolic BP 78 78 78  Wt. (Lbs) 189 189 198  BMI 33.48 33.48 35.07    Physical Exam Vitals reviewed.  Constitutional:      Appearance: Normal appearance. She is normal weight.  Neck:     Vascular: No carotid bruit.  Cardiovascular:     Rate and Rhythm: Normal rate and regular rhythm.     Pulses: Normal pulses.     Heart sounds: Normal heart sounds.  Pulmonary:     Effort: Pulmonary effort is normal. No respiratory distress.     Breath sounds: Normal breath sounds.  Abdominal:     General: Abdomen is flat. Bowel sounds are normal.     Palpations: Abdomen is soft.     Tenderness: There is no abdominal tenderness.  Neurological:     Mental Status: She is alert and oriented to person, place, and time.  Psychiatric:        Mood and Affect: Mood normal.        Behavior: Behavior normal.    Diabetic Foot Exam - Simple   No data filed      Lab Results  Component Value Date   WBC 6.9 07/05/2021   HGB 15.2 07/05/2021   HCT 45.2 07/05/2021   PLT 284 07/05/2021   GLUCOSE 89 07/05/2021   CHOL 306 (H) 07/05/2021   TRIG 113 07/05/2021   HDL 73 07/05/2021   LDLCALC 213 (H) 07/05/2021   ALT 25 07/05/2021   AST 26 07/05/2021   NA 141 07/05/2021   K 4.7 07/05/2021   CL 102 07/05/2021   CREATININE 0.91 07/05/2021   BUN 10 07/05/2021   CO2 27 07/05/2021   TSH 1.990 07/05/2021   HGBA1C 5.5 07/05/2021      Assessment & Plan:   Problem List Items Addressed This Visit       Cardiovascular and Mediastinum   Essential hypertension, benign - Primary    Check bp daily one hour after am medicines. Continue lisinopril 40 mg bid.  Keep a log.  Take chlorthalidone 25 mg 1/2 if bp greater than 140.      Follow-up:  Return in about 7 weeks (around 10/12/2021) for chronic fasting.  An After Visit Summary was printed and given to the patient.  Rochel Brome, MD [facility] [phone]

## 2021-08-24 ENCOUNTER — Ambulatory Visit: Payer: BC Managed Care – PPO | Admitting: Family Medicine

## 2021-08-24 ENCOUNTER — Other Ambulatory Visit: Payer: Self-pay

## 2021-08-24 ENCOUNTER — Encounter: Payer: Self-pay | Admitting: Family Medicine

## 2021-08-24 VITALS — BP 148/78 | HR 65 | Temp 97.4°F | Ht 63.0 in | Wt 189.0 lb

## 2021-08-24 DIAGNOSIS — I10 Essential (primary) hypertension: Secondary | ICD-10-CM | POA: Diagnosis not present

## 2021-08-24 NOTE — Patient Instructions (Signed)
Check bp daily one hour after am medicines. Keep a log.  Take chlorthalidone 25 mg 1/2 if bp greater than 140.

## 2021-08-28 NOTE — Assessment & Plan Note (Signed)
Check bp daily one hour after am medicines. Continue lisinopril 40 mg bid.  Keep a log.  Take chlorthalidone 25 mg 1/2 if bp greater than 140.

## 2021-09-04 ENCOUNTER — Other Ambulatory Visit: Payer: Self-pay | Admitting: Family Medicine

## 2021-10-11 ENCOUNTER — Ambulatory Visit: Payer: BC Managed Care – PPO | Admitting: Family Medicine

## 2021-10-11 ENCOUNTER — Other Ambulatory Visit: Payer: Self-pay

## 2021-10-11 ENCOUNTER — Encounter: Payer: Self-pay | Admitting: Family Medicine

## 2021-10-11 VITALS — BP 144/78 | HR 67 | Temp 97.0°F | Resp 16 | Ht 62.0 in | Wt 180.6 lb

## 2021-10-11 DIAGNOSIS — I10 Essential (primary) hypertension: Secondary | ICD-10-CM

## 2021-10-11 DIAGNOSIS — Z6833 Body mass index (BMI) 33.0-33.9, adult: Secondary | ICD-10-CM

## 2021-10-11 DIAGNOSIS — E039 Hypothyroidism, unspecified: Secondary | ICD-10-CM | POA: Diagnosis not present

## 2021-10-11 DIAGNOSIS — E782 Mixed hyperlipidemia: Secondary | ICD-10-CM | POA: Diagnosis not present

## 2021-10-11 DIAGNOSIS — F419 Anxiety disorder, unspecified: Secondary | ICD-10-CM

## 2021-10-11 DIAGNOSIS — K219 Gastro-esophageal reflux disease without esophagitis: Secondary | ICD-10-CM | POA: Diagnosis not present

## 2021-10-11 NOTE — Progress Notes (Signed)
Subjective:  Patient ID: Caroline Kelly, female    DOB: May 13, 1961  Age: 61 y.o. MRN: [id number]  Chief Complaint  Patient presents with   Hypertension   Hyperlipidemia   Hypothyroidism    HPI Hypertension:  Takes Lisinopril 40 mg twice daily   Hyperlipidemia:  Atorvastatin 40 mg daily  Hypothyroidism:  Levothyroxine 25 mcg daily   Insomnia: Has some difficulty waking up in the middle of the night.  Has episodes where she could not stop crying Exercising. Walking at gym. Eating healthier.   PHQ9 SCORE ONLY 10/11/2021 11/17/2020 10/25/2019  PHQ-9 Total Score 7 0 0     Current Outpatient Medications on File Prior to Visit  Medication Sig Dispense Refill   albuterol (VENTOLIN HFA) 108 (90 Base) MCG/ACT inhaler Inhale 2 puffs into the lungs every 6 (six) hours as needed for wheezing or shortness of breath. 8 g 0   ALLERGY RELIEF 10 MG tablet TAKE ONE TABLET BY MOUTH EVERY DAY (Patient not taking: Reported on 10/11/2021) 90 tablet 1   aspirin EC 81 MG tablet Take 1 tablet (81 mg total) by mouth daily. Swallow whole. 30 tablet 11   atorvastatin (LIPITOR) 40 MG tablet Take 1 tablet (40 mg total) by mouth daily. 90 tablet 0   B Complex Vitamins (VITAMIN B COMPLEX PO) Take by mouth.     calcium-vitamin D (OSCAL WITH D) 500-200 MG-UNIT tablet Take 1 tablet by mouth daily with breakfast.     Coenzyme Q10 (CO Q 10 PO) Take 1 tablet by mouth daily at 6 (six) AM.     levothyroxine (SYNTHROID) 25 MCG tablet TAKE ONE TABLET BY MOUTH ONCE DAILY 90 tablet 3   lisinopril (ZESTRIL) 40 MG tablet TAKE ONE TABLET BY MOUTH TWICE DAILY 180 tablet 0   nitroGLYCERIN (NITROSTAT) 0.4 MG SL tablet Place 1 tablet (0.4 mg total) under the tongue every 5 (five) minutes as needed for chest pain. 25 tablet 3   Probiotic Product (PROBIOTIC-10 PO) Take 1 tablet by mouth daily.     No current facility-administered medications on file prior to visit.   Past Medical History:  Diagnosis Date   Allergic rhinitis     Allergy    GERD (gastroesophageal reflux disease)    Hyperlipidemia    Hypertension    Past Surgical History:  Procedure Laterality Date   CESAREAN SECTION     ENDOMETRIAL ABLATION  1989    Family History  Problem Relation Age of Onset   Heart failure Mother    Dementia Mother    Hyperlipidemia Father    Hypertension Father    CAD Father    Hypertension Sister    Hypertension Brother    Social History   Socioeconomic History   Marital status: Married    Spouse name: Not on file   Number of children: Not on file   Years of education: Not on file   Highest education level: Not on file  Occupational History   Not on file  Tobacco Use   Smoking status: Former    Packs/day: 0.50    Types: Cigarettes    Quit date: 09/2017    Years since quitting: 4.1   Smokeless tobacco: Never  Substance and Sexual Activity   Alcohol use: Yes    Comment: twice monthly   Drug use: Never   Sexual activity: Not on file  Other Topics Concern   Not on file  Social History Narrative   Not on file   Social Determinants  of Health   Financial Resource Strain: Not on file  Food Insecurity: Not on file  Transportation Needs: Not on file  Physical Activity: Not on file  Stress: Not on file  Social Connections: Not on file    Review of Systems  Constitutional:  Negative for chills, fatigue and fever.  HENT:  Negative for congestion, rhinorrhea and sore throat.   Respiratory:  Negative for cough and shortness of breath.   Cardiovascular:  Negative for chest pain.  Gastrointestinal:  Negative for abdominal pain, constipation, diarrhea, nausea and vomiting.  Genitourinary:  Negative for dysuria and urgency.  Musculoskeletal:  Negative for back pain and myalgias.  Neurological:  Negative for dizziness, weakness, light-headedness and headaches.  Psychiatric/Behavioral:  Positive for dysphoric mood. The patient is not nervous/anxious.     Objective:  BP (!) 144/78    Pulse 67    Temp (!)  97 F (36.1 C)    Resp 16    Ht 5\' 2"  (1.575 m)    Wt 180 lb 9.6 oz (81.9 kg)    SpO2 96%    BMI 33.03 kg/m   BP/Weight 10/11/2021 08/24/2021 07/05/2021  Systolic BP 144 148 134  Diastolic BP 78 78 78  Wt. (Lbs) 180.6 189 189  BMI 33.03 33.48 33.48    Physical Exam Vitals reviewed.  Constitutional:      Appearance: Normal appearance. She is normal weight.  Neck:     Vascular: No carotid bruit.  Cardiovascular:     Rate and Rhythm: Normal rate and regular rhythm.     Heart sounds: Normal heart sounds.  Pulmonary:     Effort: Pulmonary effort is normal. No respiratory distress.     Breath sounds: Normal breath sounds.  Abdominal:     General: Abdomen is flat. Bowel sounds are normal.     Palpations: Abdomen is soft.     Tenderness: There is no abdominal tenderness.  Neurological:     Mental Status: She is alert and oriented to person, place, and time.  Psychiatric:        Mood and Affect: Mood normal.        Behavior: Behavior normal.    Diabetic Foot Exam - Simple   No data filed      Lab Results  Component Value Date   WBC 6.9 07/05/2021   HGB 15.2 07/05/2021   HCT 45.2 07/05/2021   PLT 284 07/05/2021   GLUCOSE 89 07/05/2021   CHOL 306 (H) 07/05/2021   TRIG 113 07/05/2021   HDL 73 07/05/2021   LDLCALC 213 (H) 07/05/2021   ALT 25 07/05/2021   AST 26 07/05/2021   NA 141 07/05/2021   K 4.7 07/05/2021   CL 102 07/05/2021   CREATININE 0.91 07/05/2021   BUN 10 07/05/2021   CO2 27 07/05/2021   TSH 1.990 07/05/2021   HGBA1C 5.5 07/05/2021      Assessment & Plan:   Problem List Items Addressed This Visit       Cardiovascular and Mediastinum   Essential hypertension, benign    Little high.  Patient to monitor. Continue lisinopril 40 mg twice daily  Continue to work on eating a healthy diet and exercise.  Labs drawn today.        Relevant Orders   CBC with Differential/Platelet   Comprehensive metabolic panel     Digestive   GERD (gastroesophageal  reflux disease) - Primary    The current medical regimen is effective;  continue present plan and  medications.         Endocrine   Acquired hypothyroidism     Other   Mixed hyperlipidemia    Well controlled.  No changes to medicines.  Continue to work on eating a healthy diet and exercise.  Labs drawn today.        Relevant Orders   Lipid panel   BMI 33.0-33.9,adult    Improved Continue eating healthy and exercise.       Anxiety    Start on lorazepam 0.5 mg one twice a day as needed for severe anxiety     .  No orders of the defined types were placed in this encounter.   Orders Placed This Encounter  Procedures   CBC with Differential/Platelet   Comprehensive metabolic panel   Lipid panel     Follow-up: Return in about 4 weeks (around 11/08/2021) for  cpe. not fasting.Marland Kitchen  An After Visit Summary was printed and given to the patient.  Blane Ohara, MD [facility] [phone]

## 2021-10-11 NOTE — Assessment & Plan Note (Signed)
Improved Continue eating healthy and exercise.

## 2021-10-11 NOTE — Assessment & Plan Note (Signed)
The current medical regimen is effective;  continue present plan and medications.  

## 2021-10-11 NOTE — Patient Instructions (Addendum)
Recommend [facility] or [facility].  Start on lorazepam 0.5 mg one twice daily as needed for severe anxiety.  Check BP daily. Call if over 140/90 consistently. Occasional is okay, but consistently and we will need to add medicine.

## 2021-10-11 NOTE — Assessment & Plan Note (Signed)
Little high.  Patient to monitor. Continue lisinopril 40 mg twice daily  Continue to work on eating a healthy diet and exercise.  Labs drawn today.

## 2021-10-11 NOTE — Assessment & Plan Note (Signed)
Well controlled.  ?No changes to medicines.  ?Continue to work on eating a healthy diet and exercise.  ?Labs drawn today.  ?

## 2021-10-11 NOTE — Assessment & Plan Note (Signed)
Start on lorazepam 0.5 mg one twice a day as needed for severe anxiety

## 2021-10-12 ENCOUNTER — Other Ambulatory Visit: Payer: Self-pay | Admitting: Family Medicine

## 2021-10-12 ENCOUNTER — Other Ambulatory Visit: Payer: Self-pay

## 2021-10-12 LAB — CBC WITH DIFFERENTIAL/PLATELET
Basophils Absolute: 0.1 10*3/uL (ref 0.0–0.2)
Basos: 1 %
EOS (ABSOLUTE): 0.1 10*3/uL (ref 0.0–0.4)
Eos: 1 %
Hematocrit: 42.4 % (ref 34.0–46.6)
Hemoglobin: 14.3 g/dL (ref 11.1–15.9)
Immature Grans (Abs): 0 10*3/uL (ref 0.0–0.1)
Immature Granulocytes: 0 %
Lymphocytes Absolute: 2.3 10*3/uL (ref 0.7–3.1)
Lymphs: 40 %
MCH: 30.5 pg (ref 26.6–33.0)
MCHC: 33.7 g/dL (ref 31.5–35.7)
MCV: 90 fL (ref 79–97)
Monocytes Absolute: 0.5 10*3/uL (ref 0.1–0.9)
Monocytes: 9 %
Neutrophils Absolute: 2.9 10*3/uL (ref 1.4–7.0)
Neutrophils: 49 %
Platelets: 250 10*3/uL (ref 150–450)
RBC: 4.69 x10E6/uL (ref 3.77–5.28)
RDW: 13.1 % (ref 11.7–15.4)
WBC: 5.9 10*3/uL (ref 3.4–10.8)

## 2021-10-12 LAB — COMPREHENSIVE METABOLIC PANEL
ALT: 24 IU/L (ref 0–32)
AST: 30 IU/L (ref 0–40)
Albumin/Globulin Ratio: 2 (ref 1.2–2.2)
Albumin: 4.5 g/dL (ref 3.8–4.9)
Alkaline Phosphatase: 60 IU/L (ref 44–121)
BUN/Creatinine Ratio: 14 (ref 9–23)
BUN: 14 mg/dL (ref 6–24)
Bilirubin Total: 0.4 mg/dL (ref 0.0–1.2)
CO2: 25 mmol/L (ref 20–29)
Calcium: 10 mg/dL (ref 8.7–10.2)
Chloride: 104 mmol/L (ref 96–106)
Creatinine, Ser: 1 mg/dL (ref 0.57–1.00)
Globulin, Total: 2.3 g/dL (ref 1.5–4.5)
Glucose: 93 mg/dL (ref 70–99)
Potassium: 4 mmol/L (ref 3.5–5.2)
Sodium: 142 mmol/L (ref 134–144)
Total Protein: 6.8 g/dL (ref 6.0–8.5)
eGFR: 65 mL/min/{1.73_m2} (ref >59)

## 2021-10-12 LAB — LIPID PANEL
Chol/HDL Ratio: 2.9 ratio (ref 0.0–4.4)
Cholesterol, Total: 160 mg/dL (ref 100–199)
HDL: 55 mg/dL (ref >39)
LDL Chol Calc (NIH): 91 mg/dL (ref 0–99)
Triglycerides: 71 mg/dL (ref 0–149)
VLDL Cholesterol Cal: 14 mg/dL (ref 5–40)

## 2021-10-12 LAB — CARDIOVASCULAR RISK ASSESSMENT

## 2021-10-12 MED ORDER — LORAZEPAM 0.5 MG PO TABS: 0.5000 mg | ORAL_TABLET | Freq: Two times a day (BID) | ORAL | 1 refills | Status: DC | PRN

## 2021-10-12 MED ORDER — ATORVASTATIN CALCIUM 40 MG PO TABS: 40.0000 mg | ORAL_TABLET | Freq: Every day | ORAL | 0 refills | Status: DC

## 2021-10-12 NOTE — Progress Notes (Unsigned)
Was not sent yesterday. Pended for Dr approval.   Royce Macadamia, [place] 10/12/21 2:28 PM

## 2021-10-12 NOTE — Progress Notes (Signed)
Blood count normal.  Liver function normal.  Kidney function normal.  Cholesterol: AWESOME!! LDL DROPPED FROM 213 TO 91!!  The current medical regimen is effective;  continue present plan and medications. Recommend continue to work on eating healthy diet and exercise. Good job!

## 2021-10-19 ENCOUNTER — Other Ambulatory Visit: Payer: Self-pay | Admitting: Physician Assistant

## 2021-11-09 ENCOUNTER — Other Ambulatory Visit: Payer: Self-pay

## 2021-11-09 ENCOUNTER — Encounter: Payer: Self-pay | Admitting: Family Medicine

## 2021-11-09 ENCOUNTER — Ambulatory Visit (INDEPENDENT_AMBULATORY_CARE_PROVIDER_SITE_OTHER): Payer: BC Managed Care – PPO | Admitting: Family Medicine

## 2021-11-09 VITALS — BP 130/74 | HR 60 | Temp 97.0°F | Resp 18 | Ht 62.0 in | Wt 175.0 lb

## 2021-11-09 DIAGNOSIS — E6609 Other obesity due to excess calories: Secondary | ICD-10-CM | POA: Insufficient documentation

## 2021-11-09 DIAGNOSIS — Z7689 Persons encountering health services in other specified circumstances: Secondary | ICD-10-CM | POA: Insufficient documentation

## 2021-11-09 DIAGNOSIS — Z0000 Encounter for general adult medical examination without abnormal findings: Secondary | ICD-10-CM | POA: Diagnosis not present

## 2021-11-09 DIAGNOSIS — I10 Essential (primary) hypertension: Secondary | ICD-10-CM

## 2021-11-09 DIAGNOSIS — Z124 Encounter for screening for malignant neoplasm of cervix: Secondary | ICD-10-CM | POA: Diagnosis not present

## 2021-11-09 DIAGNOSIS — Z202 Contact with and (suspected) exposure to infections with a predominantly sexual mode of transmission: Secondary | ICD-10-CM | POA: Insufficient documentation

## 2021-11-09 DIAGNOSIS — Z6832 Body mass index (BMI) 32.0-32.9, adult: Secondary | ICD-10-CM

## 2021-11-09 DIAGNOSIS — Z23 Encounter for immunization: Secondary | ICD-10-CM

## 2021-11-09 DIAGNOSIS — Z1211 Encounter for screening for malignant neoplasm of colon: Secondary | ICD-10-CM | POA: Insufficient documentation

## 2021-11-09 DIAGNOSIS — Z1231 Encounter for screening mammogram for malignant neoplasm of breast: Secondary | ICD-10-CM | POA: Insufficient documentation

## 2021-11-09 MED ORDER — CHLORTHALIDONE 25 MG PO TABS: 25.0000 mg | ORAL_TABLET | Freq: Every day | ORAL | 1 refills | Status: DC

## 2021-11-09 NOTE — Progress Notes (Unsigned)
Subjective:  Patient ID: [name], female    DOB: [birth year]  Age: 61 y.o. MRN: [id number]  Chief Complaint  Patient presents with   Annual Exam    HPI Well Adult Physical: Patient here for a comprehensive physical exam.The patient reports  blood pressure issues. Do you take any herbs or supplements that were not prescribed by a doctor? no Are you taking calcium supplements? no Are you taking aspirin daily? no  Physical ("At Risk" items are starred): Patient's last physical exam was 1 year ago .  Safety: reviewed ; Patient wears a seat belt, has smoke detectors, has carbon monoxide detectors, practices appropriate gun safety, and wears sunscreen with extended sun exposure. Dental Care:  brushes and flosses daily. Ophthalmology/Optometry: Annual visit.  Hearing loss: none Vision impairments: none  HYPERTENSION: sbp 150s with headaches. ON lisinopril 40 mg one twice daily. Taking chlorthalidone 25 mg 1/2 daily. This has helped her bp.   Fall Risk  11/17/2020  Falls in the past year? 0  Number falls in past yr: 0  Injury with Fall? 0  Risk for fall due to : No Fall Risks  Follow up Falls evaluation completed     Depression screen [facility] 2/9 10/11/2021 11/17/2020 10/25/2019  Decreased Interest 1 0 0  Down, Depressed, Hopeless 1 0 0  PHQ - 2 Score 2 0 0  Altered sleeping 3 - -  Tired, decreased energy 0 - -  Change in appetite 1 - -  Feeling bad or failure about yourself  0 - -  Trouble concentrating 1 - -  Moving slowly or fidgety/restless 0 - -  Suicidal thoughts 0 - -  PHQ-9 Score 7 - -     Functional Status Survey:     Health Maintenance  Topic Date Due   HIV Screening  Never done   Hepatitis C Screening: USPSTF Recommendation to screen - Ages 28-79 yo.  Never done   Pap Smear  Never done   Colon Cancer Screening  Never done   COVID-19 Vaccine (4 - Booster for Pfizer series) 08/11/2020   Mammogram  10/26/2021   Tetanus Vaccine  08/24/2022*   Flu Shot  Completed    Zoster (Shingles) Vaccine  Completed   HPV Vaccine  Aged Out  *Topic was postponed. The date shown is not the original due date.     Social Hx   Social History   Socioeconomic History   Marital status: Married    Spouse name: Not on file   Number of children: Not on file   Years of education: Not on file   Highest education level: Not on file  Occupational History   Not on file  Tobacco Use   Smoking status: Former    Packs/day: 0.50    Types: Cigarettes    Quit date: 09/2017    Years since quitting: 4.1   Smokeless tobacco: Never  Substance and Sexual Activity   Alcohol use: Yes    Comment: twice monthly   Drug use: Never   Sexual activity: Not on file  Other Topics Concern   Not on file  Social History Narrative   Not on file   Social Determinants of Health   Financial Resource Strain: Low Risk    Difficulty of Paying Living Expenses: Not hard at all  Food Insecurity: No Food Insecurity   Worried About Programme researcher, broadcasting/film/video in the Last Year: Never true   Ran Out of Food in the Last Year: Never  true  Transportation Needs: No Transportation Needs   Lack of Transportation (Medical): No   Lack of Transportation (Non-Medical): No  Physical Activity: Sufficiently Active   Days of Exercise per Week: 3 days   Minutes of Exercise per Session: 60 min  Stress: Stress Concern Present   Feeling of Stress : Very much  Social Connections: Moderately Integrated   Frequency of Communication with Friends and Family: More than three times a week   Frequency of Social Gatherings with Friends and Family: More than three times a week   Attends Religious Services: More than 4 times per year   Active Member of [employer] or Organizations: Yes   Attends Engineer, structural: More than 4 times per year   Marital Status: Separated   Past Medical History:  Diagnosis Date   Allergic rhinitis    Allergy    GERD (gastroesophageal reflux disease)    Hyperlipidemia    Hypertension     Family History  Problem Relation Age of Onset   Heart failure Mother    Dementia Mother    Hyperlipidemia Father    Hypertension Father    CAD Father    Hypertension Sister    Hypertension Brother     Review of Systems  Constitutional:  Positive for fatigue. Negative for chills and fever.  HENT:  Negative for congestion, rhinorrhea and sore throat.   Respiratory:  Negative for cough and shortness of breath.   Cardiovascular:  Negative for chest pain and palpitations.  Gastrointestinal:  Positive for nausea. Negative for abdominal pain, constipation, diarrhea and vomiting.  Genitourinary:  Negative for dysuria and urgency.  Musculoskeletal:  Negative for back pain and myalgias.  Neurological:  Positive for weakness, light-headedness and headaches. Negative for dizziness.  Psychiatric/Behavioral:  Negative for dysphoric mood. The patient is not nervous/anxious.     Objective:  BP 130/74    Pulse 60    Temp (!) 97 F (36.1 C)    Resp 18    Ht  (1.575 m)    Wt 175 lb (79.4 kg)    BMI 32.01 kg/m   BP/Weight 11/09/2021 10/11/2021 08/24/2021  Systolic BP 130 144 148  Diastolic BP 74 78 78  Wt. (Lbs) 175 180.6 189  BMI 32.01 33.03 33.48    Physical Exam Vitals reviewed. Exam conducted with a chaperone present.  Constitutional:      General: She is not in acute distress.    Appearance: Normal appearance. She is normal weight.  HENT:     Right Ear: Tympanic membrane and ear canal normal.     Left Ear: Tympanic membrane and ear canal normal.     Nose: Nose normal. No congestion or rhinorrhea.  Eyes:     Conjunctiva/sclera: Conjunctivae normal.  Neck:     Thyroid: No thyroid mass.     Vascular: No carotid bruit.  Cardiovascular:     Rate and Rhythm: Normal rate and regular rhythm.     Pulses: Normal pulses.     Heart sounds: Normal heart sounds. No murmur heard. Pulmonary:     Effort: Pulmonary effort is normal.     Breath sounds: Normal breath sounds.  Abdominal:      General: Bowel sounds are normal.     Palpations: Abdomen is soft. There is no mass.     Tenderness: There is no abdominal tenderness.  Genitourinary:    General: Normal vulva.     Exam position: Lithotomy position.     Pubic Area:  No rash.      Labia:        Right: No rash, tenderness or lesion.        Left: No rash, tenderness or lesion.      Urethra: No urethral pain.     Vagina: Normal.     Cervix: Normal.     Uterus: Normal.      Adnexa: Right adnexa normal and left adnexa normal.  Musculoskeletal:        General: Normal range of motion.  Lymphadenopathy:     Cervical: No cervical adenopathy.  Skin:    General: Skin is warm and dry.  Neurological:     Mental Status: She is alert and oriented to person, place, and time.     Cranial Nerves: No cranial nerve deficit.  Psychiatric:        Mood and Affect: Mood normal.        Behavior: Behavior normal.    Lab Results  Component Value Date   WBC 5.9 10/11/2021   HGB 14.3 10/11/2021   HCT 42.4 10/11/2021   PLT 250 10/11/2021   GLUCOSE 93 10/11/2021   CHOL 160 10/11/2021   TRIG 71 10/11/2021   HDL 55 10/11/2021   LDLCALC 91 10/11/2021   ALT 24 10/11/2021   AST 30 10/11/2021   NA 142 10/11/2021   K 4.0 10/11/2021   CL 104 10/11/2021   CREATININE 1.00 10/11/2021   BUN 14 10/11/2021   CO2 25 10/11/2021   TSH 1.990 07/05/2021   HGBA1C 5.5 07/05/2021      Assessment & Plan:   Problem List Items Addressed This Visit       Cardiovascular and Mediastinum   Essential hypertension, benign    Recommend take chlorthalidone 25 mg 1/2 daily.       Relevant Medications   chlorthalidone (HYGROTON) 25 MG tablet     Other   Routine general medical examination at a health care facility - Primary    Things to do to keep yourself healthy  - Exercise at least 30-45 minutes a day, 3-4 days a week.  - Eat a low-fat diet with lots of fruits and vegetables, up to 7-9 servings per day.  - Seatbelts can save your life. Wear  them always.  - Smoke detectors on every level of your home, check batteries every year.  - Eye Doctor - have an eye exam every 1-2 years  - Safe sex - if you may be exposed to STDs, use a condom.  - Alcohol -  If you drink, do it moderately, less than 2 drinks per day.  - Health Care Power of Attorney. Choose someone to speak for you if you are not able.  - Depression is common in our stressful world.If you're feeling down or losing interest in things you normally enjoy, please come in for a visit.  - Violence - If anyone is threatening or hurting you, please call immediately.        Visit for screening mammogram   Relevant Orders   Mammogram Digital Screening   Colon cancer screening    Order cologuard.       Relevant Orders   Cologuard   Cervical cancer screening    Order pap, with hpv testing.      Relevant Orders   IGP, Aptima HPV, rfx 16/18,45   Encounter for assessment of STD exposure    Check hiv, hep c, herpes, gc, chlamydia, trichomonas.  Patient had possible exposure due to  husband's infidelity.      Relevant Orders   HCV Ab w Reflex to Quant PCR   HIV Antibody (routine testing w rflx)   Ct Ng TV HSV by NAA   Class 1 obesity due to excess calories with serious comorbidity and body mass index (BMI) of 32.0 to 32.9 in adult    Good job. Recommend continue to work on eating healthy diet and exercise.         Meds ordered this encounter  Medications   chlorthalidone (HYGROTON) 25 MG tablet    Sig: Take 1 tablet (25 mg total) by mouth daily.    Dispense:  90 tablet    Refill:  1    These are the goals we discussed:  Goals   None      This is a list of the screening recommended for you and due dates:  Health Maintenance  Topic Date Due   HIV Screening  Never done   Hepatitis C Screening: USPSTF Recommendation to screen - Ages 1818-79 yo.  Never done   Pap Smear  Never done   Colon Cancer Screening  Never done   COVID-19 Vaccine (4 - Booster for  Pfizer series) 08/11/2020   Mammogram  10/26/2021   Tetanus Vaccine  08/24/2022*   Flu Shot  Completed   Zoster (Shingles) Vaccine  Completed   HPV Vaccine  Aged Out  *Topic was postponed. The date shown is not the original due date.     AN INDIVIDUALIZED CARE PLAN: was established or reinforced today.   SELF MANAGEMENT: The patient and I together assessed ways to personally work towards obtaining the recommended goals  Support needs The patient and/or family needs were assessed and services were offered and not necessary at this time.    Follow-up: Return in 3 months (on 02/09/2022) for chronic fasting.  Blane OharaKirsten Fama Muenchow, MD [facility] [phone]

## 2021-11-09 NOTE — Assessment & Plan Note (Signed)

## 2021-11-09 NOTE — Patient Instructions (Addendum)
Things to do to keep yourself healthy  ?- Exercise at least 30-45 minutes a day, 3-4 days a week.  ?- Eat a low-fat diet with lots of fruits and vegetables, up to 7-9 servings per day.  ?- Seatbelts can save your life. Wear them always.  ?- Smoke detectors on every level of your home, check batteries every year.  ?- Eye Doctor - have an eye exam every 1-2 years  ?- Safe sex - if you may be exposed to STDs, use a condom.  ?- Alcohol -  If you drink, do it moderately, less than 2 drinks per day.  ?- Health Care Power of Rising StarAttorney. Choose someone to speak for you if you are not able. Please bring us a copy.  ?- Depression is common in our stressful world.If you're feeling down or losing interest in things you normally enjoy, please come in for a visit.  ?- Violence - If anyone is threatening or hurting you, please call immediately. ? ? ?Good job! You lost 5 more lbs. Keep up the good work! ? ?Recommend take chlorthalidone 25 mg 1/2 daily. If you need to increase to once daily mid day.  ? ? ?Health Maintenance, Female ?Adopting a healthy lifestyle and getting preventive care are important in promoting health and wellness. Ask your health care provider about: ?The right schedule for you to have regular tests and exams. ?Things you can do on your own to prevent diseases and keep yourself healthy. ?What should I know about diet, weight, and exercise? ?Eat a healthy diet ? ?Eat a diet that includes plenty of vegetables, fruits, low-fat dairy products, and lean protein. ?Do not eat a lot of foods that are high in solid fats, added sugars, or sodium. ?Maintain a healthy weight ?Body mass index (BMI) is used to identify weight problems. It estimates body fat based on height and weight. Your health care provider can help determine your BMI and help you achieve or maintain a healthy weight. ?Get regular exercise ?Get regular exercise. This is one of the most important things you can do for your health. Most adults  should: ?Exercise for at least 150 minutes each week. The exercise should increase your heart rate and make you sweat (moderate-intensity exercise). ?Do strengthening exercises at least twice a week. This is in addition to the moderate-intensity exercise. ?Spend less time sitting. Even light physical activity can be beneficial. ?Watch cholesterol and blood lipids ?Have your blood tested for lipids and cholesterol at 60 years of age, then have this test every 5 years. ?Have your cholesterol levels checked more often if: ?Your lipid or cholesterol levels are high. ?You are older than 60 years of age. ?You are at high risk for heart disease. ?What should I know about cancer screening? ?Depending on your health history and family history, you may need to have cancer screening at various ages. This may include screening for: ?Breast cancer. ?Cervical cancer. ?Colorectal cancer. ?Skin cancer. ?Lung cancer. ?What should I know about heart disease, diabetes, and high blood pressure? ?Blood pressure and heart disease ?High blood pressure causes heart disease and increases the risk of stroke. This is more likely to develop in people who have high blood pressure readings or are overweight. ?Have your blood pressure checked: ?Every 3-5 years if you are 8018-60 years of age. ?Every year if you are 60 years old or older. ?Diabetes ?Have regular diabetes screenings. This checks your fasting blood sugar level. Have the screening done: ?Once every three years after  age 34 if you are at a normal weight and have a low risk for diabetes. ?More often and at a younger age if you are overweight or have a high risk for diabetes. ?What should I know about preventing infection? ?Hepatitis B ?If you have a higher risk for hepatitis B, you should be screened for this virus. Talk with your health care provider to find out if you are at risk for hepatitis B infection. ?Hepatitis C ?Testing is recommended for: ?Everyone born from [birth year] through  1964. ?Anyone with known risk factors for hepatitis C. ?Sexually transmitted infections (STIs) ?Get screened for STIs, including gonorrhea and chlamydia, if: ?You are sexually active and are younger than 60 years of age. ?You are older than 60 years of age and your health care provider tells you that you are at risk for this type of infection. ?Your sexual activity has changed since you were last screened, and you are at increased risk for chlamydia or gonorrhea. Ask your health care provider if you are at risk. ?Ask your health care provider about whether you are at high risk for HIV. Your health care provider may recommend a prescription medicine to help prevent HIV infection. If you choose to take medicine to prevent HIV, you should first get tested for HIV. You should then be tested every 3 months for as long as you are taking the medicine. ?Pregnancy ?If you are about to stop having your period (premenopausal) and you may become pregnant, seek counseling before you get pregnant. ?Take 400 to 800 micrograms (mcg) of folic acid every day if you become pregnant. ?Ask for birth control (contraception) if you want to prevent pregnancy. ?Osteoporosis and menopause ?Osteoporosis is a disease in which the bones lose minerals and strength with aging. This can result in bone fractures. If you are 9 years old or older, or if you are at risk for osteoporosis and fractures, ask your health care provider if you should: ?Be screened for bone loss. ?Take a calcium or vitamin D supplement to lower your risk of fractures. ?Be given hormone replacement therapy (HRT) to treat symptoms of menopause. ?Follow these instructions at home: ?Alcohol use ?Do not drink alcohol if: ?Your health care provider tells you not to drink. ?You are pregnant, may be pregnant, or are planning to become pregnant. ?If you drink alcohol: ?Limit how much you have to: ?0-1 drink a day. ?Know how much alcohol is in your drink. In the [place] one drink  equals one 12 oz bottle of beer (355 mL), one 5 oz glass of wine (148 mL), or one 1? oz glass of hard liquor (44 mL). ?Lifestyle ?Do not use any products that contain nicotine or tobacco. These products include cigarettes, chewing tobacco, and vaping devices, such as e-cigarettes. If you need help quitting, ask your health care provider. ?Do not use street drugs. ?Do not share needles. ?Ask your health care provider for help if you need support or information about quitting drugs. ?General instructions ?Schedule regular health, dental, and eye exams. ?Stay current with your vaccines. ?Tell your health care provider if: ?You often feel depressed. ?You have ever been abused or do not feel safe at home. ?Summary ?Adopting a healthy lifestyle and getting preventive care are important in promoting health and wellness. ?Follow your health care provider's instructions about healthy diet, exercising, and getting tested or screened for diseases. ?Follow your health care provider's instructions on monitoring your cholesterol and blood pressure. ?This information is not intended to replace  advice given to you by your health care provider. Make sure you discuss any questions you have with your health care provider. ?Document Revised: 01/09/2021 Document Reviewed: 01/09/2021 ?Elsevier Patient Education ? 2022 [employer] ? ?

## 2021-11-09 NOTE — Assessment & Plan Note (Signed)
Recommend take chlorthalidone 25 mg 1/2 daily.

## 2021-11-09 NOTE — Assessment & Plan Note (Signed)
Good job. Recommend continue to work on eating healthy diet and exercise. ? ?

## 2021-11-09 NOTE — Assessment & Plan Note (Signed)
Check hiv, hep c, herpes, gc, chlamydia, trichomonas.  ?Patient had possible exposure due to husband's infidelity. ?

## 2021-11-09 NOTE — Assessment & Plan Note (Signed)
Order cologuard 

## 2021-11-09 NOTE — Assessment & Plan Note (Addendum)
Order pap, with hpv testing. ?

## 2021-11-13 LAB — HIV ANTIBODY (ROUTINE TESTING W REFLEX): HIV Screen 4th Generation wRfx: NONREACTIVE

## 2021-11-13 LAB — CT NG TV HSV BY NAA
Chlamydia by NAA: NEGATIVE
Gonococcus by NAA: NEGATIVE
HSV 1 NAA: NEGATIVE
HSV 2 NAA: NEGATIVE
Trich vag by NAA: NEGATIVE

## 2021-11-13 LAB — HCV AB W REFLEX TO QUANT PCR: HCV Ab: NONREACTIVE

## 2021-11-13 LAB — HCV INTERPRETATION

## 2021-11-14 LAB — IGP, APTIMA HPV, RFX 16/18,45
HPV Aptima: NEGATIVE
PAP Smear Comment: 0

## 2021-11-26 NOTE — Assessment & Plan Note (Signed)
Order mammogram °

## 2021-12-05 ENCOUNTER — Other Ambulatory Visit: Payer: Self-pay | Admitting: Family Medicine

## 2021-12-05 NOTE — Telephone Encounter (Signed)
Refill sent to pharmacy.   

## 2021-12-08 ENCOUNTER — Other Ambulatory Visit: Payer: Self-pay | Admitting: Family Medicine

## 2021-12-08 DIAGNOSIS — Z1231 Encounter for screening mammogram for malignant neoplasm of breast: Secondary | ICD-10-CM

## 2021-12-27 ENCOUNTER — Ambulatory Visit
Admission: RE | Admit: 2021-12-27 | Discharge: 2021-12-27 | Disposition: A | Discharge status: Home / Self Care | Payer: BC Managed Care – PPO | Source: Ambulatory Visit | Attending: Family Medicine | Admitting: Family Medicine

## 2021-12-27 DIAGNOSIS — Z1231 Encounter for screening mammogram for malignant neoplasm of breast: Secondary | ICD-10-CM

## 2021-12-28 ENCOUNTER — Encounter: Payer: Self-pay | Admitting: Physician Assistant

## 2021-12-28 ENCOUNTER — Ambulatory Visit: Payer: BC Managed Care – PPO | Admitting: Physician Assistant

## 2021-12-28 VITALS — BP 136/78 | HR 65 | Temp 97.2°F | Ht 62.0 in | Wt 175.6 lb

## 2021-12-28 DIAGNOSIS — F419 Anxiety disorder, unspecified: Secondary | ICD-10-CM | POA: Diagnosis not present

## 2021-12-28 DIAGNOSIS — R5381 Other malaise: Secondary | ICD-10-CM

## 2021-12-28 DIAGNOSIS — I10 Essential (primary) hypertension: Secondary | ICD-10-CM

## 2021-12-28 NOTE — Progress Notes (Signed)
? ?Acute Office Visit ? ?Subjective:  ? ? Patient ID: Caroline Kelly, female    DOB: Sep 26, 1960, 61 y.o.   MRN: [id number] ? ?Chief Complaint  ?Patient presents with  ? Hypertension  ? ? ?HPI: ?Patient is in today for complaints of fluctuating blood pressure.  Pt states that she has been going through a lot of stress and anxiety with current separation of husband.  She has felt overall malaise and fatigue and had upset stomach earlier in week (after being served separation papers) ?She states on Monday she felt like her blood pressure was low and took at home with reading 116/50s. Since that time her bp has fluctuated and this morning it was elevated to 170/80 at home.  She states she feels bad when her systolic reading gets above 130 ?Her current bp med is zestril 3m bid and she does have chlorthalidone 266mthat she is supposed to be using if bp elevates systolically over 14144owever she has not been using this medication at all ?She also has rx for ativan at home to use as needed for anxiety which she is not using at this time ?She denies chest pain/dyspnea or edema ? ?Past Medical History:  ?Diagnosis Date  ? Allergic rhinitis   ? Allergy   ? GERD (gastroesophageal reflux disease)   ? Hyperlipidemia   ? Hypertension   ? ? ?Past Surgical History:  ?Procedure Laterality Date  ? CESAREAN SECTION    ? ENDOMETRIAL ABLATION  1989  ? ? ?Family History  ?Problem Relation Age of Onset  ? Heart failure Mother   ? Dementia Mother   ? Hyperlipidemia Father   ? Hypertension Father   ? CAD Father   ? Hypertension Sister   ? Hypertension Brother   ? Breast cancer Neg Hx   ? ? ?Social History  ? ?Socioeconomic History  ? Marital status: Married  ?  Spouse name: Not on file  ? Number of children: Not on file  ? Years of education: Not on file  ? Highest education level: Not on file  ?Occupational History  ? Not on file  ?Tobacco Use  ? Smoking status: Former  ?  Packs/day: 0.50  ?  Types: Cigarettes  ?  Quit date: 09/2017  ?   Years since quitting: 4.3  ? Smokeless tobacco: Never  ?Substance and Sexual Activity  ? Alcohol use: Yes  ?  Comment: twice monthly  ? Drug use: Never  ? Sexual activity: Not on file  ?Other Topics Concern  ? Not on file  ?Social History Narrative  ? Not on file  ? ?Social Determinants of Health  ? ?Financial Resource Strain: Low Risk   ? Difficulty of Paying Living Expenses: Not hard at all  ?Food Insecurity: No Food Insecurity  ? Worried About RuCharity fundraisern the Last Year: Never true  ? Ran Out of Food in the Last Year: Never true  ?Transportation Needs: No Transportation Needs  ? Lack of Transportation (Medical): No  ? Lack of Transportation (Non-Medical): No  ?Physical Activity: Sufficiently Active  ? Days of Exercise per Week: 3 days  ? Minutes of Exercise per Session: 60 min  ?Stress: Stress Concern Present  ? Feeling of Stress : Very much  ?Social Connections: Moderately Integrated  ? Frequency of Communication with Friends and Family: More than three times a week  ? Frequency of Social Gatherings with Friends and Family: More than three times a week  ? Attends  Religious Services: More than 4 times per year  ? Active Member of Clubs or Organizations: Yes  ? Attends Archivist Meetings: More than 4 times per year  ? Marital Status: Separated  ?Intimate Partner Violence: Not At Risk  ? Fear of Current or Ex-Partner: No  ? Emotionally Abused: No  ? Physically Abused: No  ? Sexually Abused: No  ? ? ?Outpatient Medications Prior to Visit  ?Medication Sig Dispense Refill  ? albuterol (VENTOLIN HFA) 108 (90 Base) MCG/ACT inhaler Inhale 2 puffs into the lungs every 6 (six) hours as needed for wheezing or shortness of breath. 8 g 0  ? ALLERGY RELIEF 10 MG tablet TAKE ONE TABLET BY MOUTH EVERY DAY 90 tablet 1  ? atorvastatin (LIPITOR) 40 MG tablet Take 1 tablet (40 mg total) by mouth daily. 90 tablet 0  ? B Complex Vitamins (VITAMIN B COMPLEX PO) Take by mouth.    ? calcium-vitamin D (OSCAL WITH D)  500-200 MG-UNIT tablet Take 1 tablet by mouth daily with breakfast.    ? chlorthalidone (HYGROTON) 25 MG tablet Take 1 tablet (25 mg total) by mouth daily. 90 tablet 1  ? Coenzyme Q10 (CO Q 10 PO) Take 1 tablet by mouth daily at 6 (six) AM.    ? levothyroxine (SYNTHROID) 25 MCG tablet TAKE ONE TABLET BY MOUTH EVERY DAY 90 tablet 3  ? lisinopril (ZESTRIL) 40 MG tablet TAKE ONE TABLET BY MOUTH TWICE DAILY 180 tablet 0  ? LORazepam (ATIVAN) 0.5 MG tablet Take 1 tablet (0.5 mg total) by mouth 2 (two) times daily as needed for anxiety. 20 tablet 1  ? nitroGLYCERIN (NITROSTAT) 0.4 MG SL tablet Place 1 tablet (0.4 mg total) under the tongue every 5 (five) minutes as needed for chest pain. 25 tablet 3  ? Probiotic Product (PROBIOTIC-10 PO) Take 1 tablet by mouth daily.    ? ?No facility-administered medications prior to visit.  ? ? ?Allergies  ?Allergen Reactions  ? Levofloxacin Hives  ? Latex Rash  ? ? ?Review of Systems ?CONSTITUTIONAL: has had general malaise ?E/N/T: Negative for ear pain, nasal congestion and sore throat.  ?CARDIOVASCULAR: Negative for chest pain, dizziness, palpitations and pedal edema.  ?RESPIRATORY: Negative for recent cough and dyspnea.  ?GASTROINTESTINAL: see HPI ?INTEGUMENTARY: Negative for rash.  ?NEUROLOGICAL: see HPI ? ?   ? ?   ?Objective:  ?  ?Physical Exam ?PHYSICAL EXAM:  ? ?VS: BP 136/78   Pulse 65   Temp (!) 97.2 ?F (36.2 ?C)   Ht 5' 2"  (1.575 m)   Wt 175 lb 9.6 oz (79.7 kg)   SpO2 98%   BMI 32.12 kg/m?  ? ?GEN: Well nourished, well developed, in no acute distress  ?Cardiac: RRR; no murmurs, ?Respiratory:  normal respiratory rate and pattern with no distress - normal breath sounds with no rales, rhonchi, wheezes or rubs ?Psych: euthymic mood, appropriate affect and demeanor ? ?BP 136/78   Pulse 65   Temp (!) 97.2 ?F (36.2 ?C)   Ht 5' 2"  (1.575 m)   Wt 175 lb 9.6 oz (79.7 kg)   SpO2 98%   BMI 32.12 kg/m?  ?Wt Readings from Last 3 Encounters:  ?12/28/21 175 lb 9.6 oz (79.7 kg)   ?11/09/21 175 lb (79.4 kg)  ?10/11/21 180 lb 9.6 oz (81.9 kg)  ? ? ?Health Maintenance Due  ?Topic Date Due  ? COLONOSCOPY (Pts 45-73yr Insurance coverage will need to be confirmed)  Never done  ? COVID-19 Vaccine (4 -  Booster for [employer] series) 08/11/2020  ? ? ?There are no preventive care reminders to display for this patient. ? ? ?Lab Results  ?Component Value Date  ? TSH 1.990 07/05/2021  ? ?Lab Results  ?Component Value Date  ? WBC 5.9 10/11/2021  ? HGB 14.3 10/11/2021  ? HCT 42.4 10/11/2021  ? MCV 90 10/11/2021  ? PLT 250 10/11/2021  ? ?Lab Results  ?Component Value Date  ? NA 142 10/11/2021  ? K 4.0 10/11/2021  ? CO2 25 10/11/2021  ? GLUCOSE 93 10/11/2021  ? BUN 14 10/11/2021  ? CREATININE 1.00 10/11/2021  ? BILITOT 0.4 10/11/2021  ? ALKPHOS 60 10/11/2021  ? AST 30 10/11/2021  ? ALT 24 10/11/2021  ? PROT 6.8 10/11/2021  ? ALBUMIN 4.5 10/11/2021  ? CALCIUM 10.0 10/11/2021  ? EGFR 65 10/11/2021  ? ?Lab Results  ?Component Value Date  ? CHOL 160 10/11/2021  ? ?Lab Results  ?Component Value Date  ? HDL 55 10/11/2021  ? ?Lab Results  ?Component Value Date  ? Bent 91 10/11/2021  ? ?Lab Results  ?Component Value Date  ? TRIG 71 10/11/2021  ? ?Lab Results  ?Component Value Date  ? CHOLHDL 2.9 10/11/2021  ? ?Lab Results  ?Component Value Date  ? HGBA1C 5.5 07/05/2021  ? ? ?   ?Assessment & Plan:  ? ?Problem List Items Addressed This Visit   ? ?  ? Other  ? Anxiety ?Recommend to take ativan as needed for anxiety  ? ?Other Visit Diagnoses   ? ? Essential hypertension    -  Primary  ? Relevant Orders  ? CBC with Differential/Platelet  ? Comprehensive metabolic panel ?Continue current meds as directed and utilize chlorthalidone 73m 1/2 tablet as needed for systolic bp above 1253 ? Malaise     ?Labwork pending  ? ?  ? ?No orders of the defined types were placed in this encounter. ? ? ?Orders Placed This Encounter  ?Procedures  ? CBC with Differential/Platelet  ? Comprehensive metabolic panel  ?  ? ?Follow-up: Return  in about 1 week (around 01/04/2022) for nurse visit bp check. ? ?An After Visit Summary was printed and given to the patient. ? ?SARA R Kamaryn Grimley, PA-C ?[place]?([phone]?

## 2021-12-29 LAB — CBC WITH DIFFERENTIAL/PLATELET
Basophils Absolute: 0.1 10*3/uL (ref 0.0–0.2)
Basos: 1 %
EOS (ABSOLUTE): 0.1 10*3/uL (ref 0.0–0.4)
Eos: 1 %
Hematocrit: 41.6 % (ref 34.0–46.6)
Hemoglobin: 14.2 g/dL (ref 11.1–15.9)
Immature Grans (Abs): 0 10*3/uL (ref 0.0–0.1)
Immature Granulocytes: 0 %
Lymphocytes Absolute: 2 10*3/uL (ref 0.7–3.1)
Lymphs: 34 %
MCH: 30.6 pg (ref 26.6–33.0)
MCHC: 34.1 g/dL (ref 31.5–35.7)
MCV: 90 fL (ref 79–97)
Monocytes Absolute: 0.6 10*3/uL (ref 0.1–0.9)
Monocytes: 9 %
Neutrophils Absolute: 3.2 10*3/uL (ref 1.4–7.0)
Neutrophils: 55 %
Platelets: 232 10*3/uL (ref 150–450)
RBC: 4.64 x10E6/uL (ref 3.77–5.28)
RDW: 13.1 % (ref 11.7–15.4)
WBC: 5.8 10*3/uL (ref 3.4–10.8)

## 2021-12-29 LAB — COMPREHENSIVE METABOLIC PANEL
ALT: 24 IU/L (ref 0–32)
AST: 30 IU/L (ref 0–40)
Albumin/Globulin Ratio: 1.9 (ref 1.2–2.2)
Albumin: 4.5 g/dL (ref 3.8–4.9)
Alkaline Phosphatase: 62 IU/L (ref 44–121)
BUN/Creatinine Ratio: 12 (ref 9–23)
BUN: 9 mg/dL (ref 6–24)
Bilirubin Total: 0.5 mg/dL (ref 0.0–1.2)
CO2: 27 mmol/L (ref 20–29)
Calcium: 9.9 mg/dL (ref 8.7–10.2)
Chloride: 104 mmol/L (ref 96–106)
Creatinine, Ser: 0.78 mg/dL (ref 0.57–1.00)
Globulin, Total: 2.4 g/dL (ref 1.5–4.5)
Glucose: 73 mg/dL (ref 70–99)
Potassium: 3.8 mmol/L (ref 3.5–5.2)
Sodium: 143 mmol/L (ref 134–144)
Total Protein: 6.9 g/dL (ref 6.0–8.5)
eGFR: 87 mL/min/{1.73_m2} (ref >59)

## 2022-01-04 ENCOUNTER — Ambulatory Visit (INDEPENDENT_AMBULATORY_CARE_PROVIDER_SITE_OTHER): Payer: BC Managed Care – PPO

## 2022-01-04 VITALS — BP 132/80

## 2022-01-04 DIAGNOSIS — I10 Essential (primary) hypertension: Secondary | ICD-10-CM

## 2022-01-04 NOTE — Progress Notes (Signed)
Patient came in for BP check.  At her visit on 4/27 she was advised to utilize her Chlorthalidone as needed for systolic BP > 140.  She states that she has had to do that a few times however she feels her BP has been better the past few days and has not had to take the extra medication.  Last week was a very stressful week for her.    Today her BP is 132/80 and she denies any symptoms.

## 2022-01-12 ENCOUNTER — Other Ambulatory Visit: Payer: Self-pay | Admitting: Family Medicine

## 2022-02-16 ENCOUNTER — Other Ambulatory Visit: Payer: Self-pay | Admitting: Nurse Practitioner

## 2022-02-16 DIAGNOSIS — J309 Allergic rhinitis, unspecified: Secondary | ICD-10-CM

## 2022-02-16 DIAGNOSIS — J0100 Acute maxillary sinusitis, unspecified: Secondary | ICD-10-CM

## 2022-03-05 ENCOUNTER — Other Ambulatory Visit: Payer: Self-pay | Admitting: Family Medicine

## 2022-04-05 ENCOUNTER — Telehealth: Payer: Self-pay

## 2022-04-05 NOTE — Telephone Encounter (Signed)
Called Patient for a 1 yr recall, she states she has seen her primary provider and does not feel it is necessary to see [facility] anymore.  Refused a recall appointment 04-05-22  VB

## 2022-04-18 ENCOUNTER — Other Ambulatory Visit: Payer: Self-pay | Admitting: Family Medicine

## 2022-05-12 ENCOUNTER — Other Ambulatory Visit: Payer: Self-pay | Admitting: Family Medicine

## 2022-05-14 ENCOUNTER — Other Ambulatory Visit: Payer: Self-pay

## 2022-05-14 MED ORDER — LISINOPRIL 40 MG PO TABS: 40.0000 mg | ORAL_TABLET | Freq: Two times a day (BID) | ORAL | 0 refills | Status: DC

## 2022-06-30 DIAGNOSIS — J349 Unspecified disorder of nose and nasal sinuses: Secondary | ICD-10-CM | POA: Diagnosis not present

## 2022-07-05 ENCOUNTER — Telehealth: Payer: Self-pay

## 2022-07-05 NOTE — Telephone Encounter (Signed)
Patient called and stated she started feeling sick last Thursday and Saturday she went to urgent care and tested positive for Covid, she has been feeling fine the last two days than today she started back running a fever, and she is still congestion, with a little tightest in her chest. She will take her last dose of Paxlovid tonight.  Wanted to know could you call her something in. Please advise

## 2022-07-06 ENCOUNTER — Other Ambulatory Visit: Payer: Self-pay

## 2022-07-06 MED ORDER — AZITHROMYCIN 250 MG PO TABS: ORAL_TABLET | ORAL | 0 refills | Status: AC

## 2022-07-06 NOTE — Telephone Encounter (Signed)
Patient was notified. I sent prescription to pharmacy.

## 2022-08-10 ENCOUNTER — Other Ambulatory Visit: Payer: Self-pay | Admitting: Nurse Practitioner

## 2022-08-10 DIAGNOSIS — J309 Allergic rhinitis, unspecified: Secondary | ICD-10-CM

## 2022-08-10 DIAGNOSIS — J0100 Acute maxillary sinusitis, unspecified: Secondary | ICD-10-CM

## 2022-09-12 ENCOUNTER — Other Ambulatory Visit: Payer: Self-pay | Admitting: Family Medicine

## 2022-09-13 ENCOUNTER — Other Ambulatory Visit: Payer: Self-pay

## 2022-09-13 MED ORDER — LISINOPRIL 40 MG PO TABS: 40.0000 mg | ORAL_TABLET | Freq: Two times a day (BID) | ORAL | 0 refills | Status: DC

## 2022-09-17 ENCOUNTER — Encounter: Payer: Self-pay | Admitting: Nurse Practitioner

## 2022-09-17 ENCOUNTER — Ambulatory Visit: Payer: BC Managed Care – PPO | Admitting: Nurse Practitioner

## 2022-09-17 VITALS — BP 136/78 | HR 60 | Temp 97.3°F | Ht 62.0 in | Wt 183.0 lb

## 2022-09-17 DIAGNOSIS — J018 Other acute sinusitis: Secondary | ICD-10-CM

## 2022-09-17 DIAGNOSIS — R051 Acute cough: Secondary | ICD-10-CM

## 2022-09-17 DIAGNOSIS — R062 Wheezing: Secondary | ICD-10-CM | POA: Diagnosis not present

## 2022-09-17 LAB — POCT INFLUENZA A/B
Influenza A, POC: NEGATIVE
Influenza B, POC: NEGATIVE

## 2022-09-17 LAB — POC COVID19 BINAXNOW: SARS Coronavirus 2 Ag: NEGATIVE

## 2022-09-17 MED ORDER — AMOXICILLIN-POT CLAVULANATE 875-125 MG PO TABS: 1.0000 | ORAL_TABLET | Freq: Two times a day (BID) | ORAL | 0 refills | Status: DC

## 2022-09-17 MED ORDER — TRIAMCINOLONE ACETONIDE 40 MG/ML IJ SUSP
60.0000 mg | Freq: Once | INTRAMUSCULAR | Status: AC
  Administered 2022-09-17: 60 mg via INTRAMUSCULAR

## 2022-09-17 NOTE — Patient Instructions (Addendum)
Take Augmentin twice daily for 10 days Take Mucinex every 12 hours as directed Kenalog 60 mg steroid injection given in office Rest and push fluids Continue Albuterol inhaler every 4-6 hours as needed for wheezing Follow-up as needed   Sinus Infection, Adult A sinus infection is soreness and swelling (inflammation) of your sinuses. Sinuses are hollow spaces in the bones around your face. They are located: Around your eyes. In the middle of your forehead. Behind your nose. In your cheekbones. Your sinuses and nasal passages are lined with a fluid called mucus. Mucus drains out of your sinuses. Swelling can trap mucus in your sinuses. This lets germs (bacteria, virus, or fungus) grow, which leads to infection. Most of the time, this condition is caused by a virus. What are the causes? Allergies. Asthma. Germs. Things that block your nose or sinuses. Growths in the nose (nasal polyps). Chemicals or irritants in the air. A fungus. This is rare. What increases the risk? Having a weak body defense system (immune system). Doing a lot of swimming or diving. Using nasal sprays too much. Smoking. What are the signs or symptoms? The main symptoms of this condition are pain and a feeling of pressure around the sinuses. Other symptoms include: Stuffy nose (congestion). This may make it hard to breathe through your nose. Runny nose (drainage). Soreness, swelling, and warmth in the sinuses. A cough that may get worse at night. Being unable to smell and taste. Mucus that collects in the throat or the back of the nose (postnasal drip). This may cause a sore throat or bad breath. Being very tired (fatigued). A fever. How is this diagnosed? Your symptoms. Your medical history. A physical exam. Tests to find out if your condition is short-term (acute) or long-term (chronic). Your doctor may: Check your nose for growths (polyps). Check your sinuses using a tool that has a light on one end  (endoscope). Check for allergies or germs. Do imaging tests, such as an MRI or CT scan. How is this treated? Treatment for this condition depends on the cause and whether it is short-term or long-term. If caused by a virus, your symptoms should go away on their own within 10 days. You may be given medicines to relieve symptoms. They include: Medicines that shrink swollen tissue in the nose. A spray that treats swelling of the nostrils. Rinses that help get rid of thick mucus in your nose (nasal saline washes). Medicines that treat allergies (antihistamines). Over-the-counter pain relievers. If caused by bacteria, your doctor may wait to see if you will get better without treatment. You may be given antibiotic medicine if you have: A very bad infection. A weak body defense system. If caused by growths in the nose, surgery may be needed. Follow these instructions at home: Medicines Take, use, or apply over-the-counter and prescription medicines only as told by your doctor. These may include nasal sprays. If you were prescribed an antibiotic medicine, take it as told by your doctor. Do not stop taking it even if you start to feel better. Hydrate and humidify  Drink enough water to keep your pee (urine) pale yellow. Use a cool mist humidifier to keep the humidity level in your home above 50%. Breathe in steam for 10-15 minutes, 3-4 times a day, or as told by your doctor. You can do this in the bathroom while a hot shower is running. Try not to spend time in cool or dry air. Rest Rest as much as you can. Sleep with your  head raised (elevated). Make sure you get enough sleep each night. General instructions  Put a warm, moist washcloth on your face 3-4 times a day, or as often as told by your doctor. Use nasal saline washes as often as told by your doctor. Wash your hands often with soap and water. If you cannot use soap and water, use hand sanitizer. Do not smoke. Avoid being around  people who are smoking (secondhand smoke). Keep all follow-up visits. Contact a doctor if: You have a fever. Your symptoms get worse. Your symptoms do not get better within 10 days. Get help right away if: You have a very bad headache. You cannot stop vomiting. You have very bad pain or swelling around your face or eyes. You have trouble seeing. You feel confused. Your neck is stiff. You have trouble breathing. These symptoms may be an emergency. Get help right away. Call 911. Do not wait to see if the symptoms will go away. Do not drive yourself to the hospital. Summary A sinus infection is swelling of your sinuses. Sinuses are hollow spaces in the bones around your face. This condition is caused by tissues in your nose that become inflamed or swollen. This traps germs. These can lead to infection. If you were prescribed an antibiotic medicine, take it as told by your doctor. Do not stop taking it even if you start to feel better. Keep all follow-up visits. This information is not intended to replace advice given to you by your health care provider. Make sure you discuss any questions you have with your health care provider. Document Revised: 07/25/2021 Document Reviewed: 07/25/2021 Elsevier Patient Education  [place].

## 2022-09-17 NOTE — Progress Notes (Signed)
Acute Office Visit  Subjective:    Patient ID: Caroline Kelly, female    DOB: [birth year], 61 y.o.   MRN: [id number]  Chief Complaint  Patient presents with   cough/wheeze    HPI Patient is in today for URI with sinus and chest congestion, wheezing, and non-productive cough. Treatment has included Mucinex, Albuterol, and pushing fluids.  Upper respiratory symptoms She complains of non productive cough with chest congestion, wheezing, and nausea. Denies fever, chills, or dyspnea. Onset of symptoms was one week ago. Onset of symptoms was about a week ago and staying constant.She is drinking plenty of fluids.  Past history is significant for pneumonia. Patient is former smoker.    Past Medical History:  Diagnosis Date   Allergic rhinitis    Allergy    GERD (gastroesophageal reflux disease)    Hyperlipidemia    Hypertension     Past Surgical History:  Procedure Laterality Date   CESAREAN SECTION     ENDOMETRIAL ABLATION  1989    Family History  Problem Relation Age of Onset   Heart failure Mother    Dementia Mother    Hyperlipidemia Father    Hypertension Father    CAD Father    Hypertension Sister    Hypertension Brother    Breast cancer Neg Hx     Social History   Socioeconomic History   Marital status: Married    Spouse name: Not on file   Number of children: Not on file   Years of education: Not on file   Highest education level: Not on file  Occupational History   Not on file  Tobacco Use   Smoking status: Former    Packs/day: 0.50    Types: Cigarettes    Quit date: 09/2017    Years since quitting: 5.0   Smokeless tobacco: Never  Substance and Sexual Activity   Alcohol use: Yes    Comment: twice monthly   Drug use: Never   Sexual activity: Not on file  Other Topics Concern   Not on file  Social History Narrative   Not on file   Social Determinants of Health   Financial Resource Strain: Low Risk  (11/09/2021)   Overall Financial Resource  Strain (CARDIA)    Difficulty of Paying Living Expenses: Not hard at all  Food Insecurity: No Food Insecurity (11/09/2021)   Hunger Vital Sign    Worried About Running Out of Food in the Last Year: Never true    Ran Out of Food in the Last Year: Never true  Transportation Needs: No Transportation Needs (11/09/2021)   PRAPARE - Hydrologist (Medical): No    Lack of Transportation (Non-Medical): No  Physical Activity: Sufficiently Active (11/09/2021)   Exercise Vital Sign    Days of Exercise per Week: 3 days    Minutes of Exercise per Session: 60 min  Stress: Stress Concern Present (11/09/2021)   [place]    Feeling of Stress : Very much  Social Connections: Moderately Integrated (11/09/2021)   Social Connection and Isolation Panel [NHANES]    Frequency of Communication with Friends and Family: More than three times a week    Frequency of Social Gatherings with Friends and Family: More than three times a week    Attends Religious Services: More than 4 times per year    Active Member of [employer] or Organizations: Yes    Attends Archivist Meetings:  More than 4 times per year    Marital Status: Separated  Intimate Partner Violence: Not At Risk (11/09/2021)   Humiliation, Afraid, Rape, and Kick questionnaire    Fear of Current or Ex-Partner: No    Emotionally Abused: No    Physically Abused: No    Sexually Abused: No    Outpatient Medications Prior to Visit  Medication Sig Dispense Refill   albuterol (VENTOLIN HFA) 108 (90 Base) MCG/ACT inhaler Inhale 2 puffs into the lungs every 6 (six) hours as needed for wheezing or shortness of breath. 8 g 0   ALLERGY RELIEF 10 MG tablet TAKE 1 TABLET BY MOUTH ONCE DAILY 90 tablet 1   atorvastatin (LIPITOR) 40 MG tablet TAKE ONE TABLET BY MOUTH EVERY DAY 90 tablet 0   B Complex Vitamins (VITAMIN B COMPLEX PO) Take by mouth.     calcium-vitamin D (OSCAL  WITH D) 500-200 MG-UNIT tablet Take 1 tablet by mouth daily with breakfast.     chlorthalidone (HYGROTON) 25 MG tablet Take 1 tablet (25 mg total) by mouth daily. 90 tablet 1   Coenzyme Q10 (CO Q 10 PO) Take 1 tablet by mouth daily at 6 (six) AM.     levothyroxine (SYNTHROID) 25 MCG tablet TAKE ONE TABLET BY MOUTH EVERY DAY 90 tablet 3   lisinopril (ZESTRIL) 40 MG tablet Take 1 tablet (40 mg total) by mouth 2 (two) times daily. 180 tablet 0   LORazepam (ATIVAN) 0.5 MG tablet Take 1 tablet (0.5 mg total) by mouth 2 (two) times daily as needed for anxiety. 20 tablet 1   nitroGLYCERIN (NITROSTAT) 0.4 MG SL tablet Place 1 tablet (0.4 mg total) under the tongue every 5 (five) minutes as needed for chest pain. 25 tablet 3   Probiotic Product (PROBIOTIC-10 PO) Take 1 tablet by mouth daily.     No facility-administered medications prior to visit.    Allergies  Allergen Reactions   Levofloxacin Hives   Latex Rash    Review of Systems  Constitutional:  Negative for appetite change, fatigue and fever.  HENT:  Positive for congestion (sinus/chest). Negative for ear pain, sinus pressure and sore throat.   Respiratory:  Positive for cough, chest tightness, shortness of breath and wheezing.   Cardiovascular:  Negative for chest pain and palpitations.  Gastrointestinal:  Negative for abdominal pain, constipation, diarrhea, nausea and vomiting.  Genitourinary:  Negative for dysuria and hematuria.  Musculoskeletal:  Negative for arthralgias, back pain, joint swelling and myalgias.  Skin:  Negative for rash.  Neurological:  Negative for dizziness, weakness and headaches.  Psychiatric/Behavioral:  Negative for dysphoric mood. The patient is not nervous/anxious.        Objective:    Physical Exam Vitals reviewed.  Constitutional:      Appearance: Normal appearance. She is normal weight.  HENT:     Right Ear: Tympanic membrane normal.     Left Ear: Tympanic membrane normal.     Nose: Congestion  present. No rhinorrhea.     Right Sinus: Maxillary sinus tenderness present. No frontal sinus tenderness.     Left Sinus: Maxillary sinus tenderness present. No frontal sinus tenderness.     Mouth/Throat:     Pharynx: Oropharynx is clear. Posterior oropharyngeal erythema present.  Cardiovascular:     Rate and Rhythm: Normal rate and regular rhythm.     Pulses: Normal pulses.     Heart sounds: Normal heart sounds. No murmur heard. Pulmonary:     Effort: Pulmonary effort is normal.  No respiratory distress.     Breath sounds: Wheezing and rhonchi present.  Abdominal:     General: Bowel sounds are normal.     Palpations: Abdomen is soft.     Tenderness: There is no abdominal tenderness.  Neurological:     Mental Status: She is alert and oriented to person, place, and time.  Psychiatric:        Mood and Affect: Mood normal.        Behavior: Behavior normal.     BP (!) 148/78 (BP Location: Left Arm, Patient Position: Sitting)   Pulse 60   Temp (!) 97.3 F (36.3 C) (Temporal)   Ht 5\' 2"  (1.575 m)   Wt 183 lb (83 kg)   SpO2 98%   BMI 33.47 kg/m  Wt Readings from Last 3 Encounters:  09/17/22 183 lb (83 kg)  12/28/21 175 lb 9.6 oz (79.7 kg)  11/09/21 175 lb (79.4 kg)    Health Maintenance Due  Topic Date Due   COLONOSCOPY (Pts 45-36yrs Insurance coverage will need to be confirmed)  Never done   INFLUENZA VACCINE  04/03/2022   COVID-19 Vaccine (4 - 2023-24 season) 05/04/2022       Lab Results  Component Value Date   TSH 1.990 07/05/2021   Lab Results  Component Value Date   WBC 5.8 12/28/2021   HGB 14.2 12/28/2021   HCT 41.6 12/28/2021   MCV 90 12/28/2021   PLT 232 12/28/2021   Lab Results  Component Value Date   NA 143 12/28/2021   K 3.8 12/28/2021   CO2 27 12/28/2021   GLUCOSE 73 12/28/2021   BUN 9 12/28/2021   CREATININE 0.78 12/28/2021   BILITOT 0.5 12/28/2021   ALKPHOS 62 12/28/2021   AST 30 12/28/2021   ALT 24 12/28/2021   PROT 6.9 12/28/2021    ALBUMIN 4.5 12/28/2021   CALCIUM 9.9 12/28/2021   EGFR 87 12/28/2021   Lab Results  Component Value Date   CHOL 160 10/11/2021   Lab Results  Component Value Date   HDL 55 10/11/2021   Lab Results  Component Value Date   LDLCALC 91 10/11/2021   Lab Results  Component Value Date   TRIG 71 10/11/2021   Lab Results  Component Value Date   CHOLHDL 2.9 10/11/2021   Lab Results  Component Value Date   HGBA1C 5.5 07/05/2021         Assessment & Plan:   1. Acute non-recurrent sinusitis of other sinus - amoxicillin-clavulanate (AUGMENTIN) 875-125 MG tablet; Take 1 tablet by mouth 2 (two) times daily.  Dispense: 20 tablet; Refill: 0  2. Wheezing - triamcinolone acetonide (KENALOG-40) injection 60 mg  3. Acute cough - POC COVID-19 BinaxNow-NEGATIVE - POCT Influenza A/B-NEGATIVE   Take Augmentin twice daily for 10 days Take Mucinex every 12 hours as directed Kenalog 60 mg steroid injection given in office Rest and push fluids Continue Albuterol inhaler every 4-6 hours as needed for wheezing Follow-up as needed  I,Lauren M Auman,acting as a scribe for [employer], NP.,have documented all relevant documentation on the behalf of Rip Harbour, NP,as directed by  Rip Harbour, NP while in the presence of Rip Harbour, NP.   Oleta Mouse, CMA  I, Rip Harbour, NP, have reviewed all documentation for this visit. The documentation on 09/17/22 for the exam, diagnosis, procedures, and orders are all accurate and complete.   Signed Jerrell Belfast, DNP

## 2022-09-27 ENCOUNTER — Other Ambulatory Visit: Payer: Self-pay | Admitting: Nurse Practitioner

## 2022-09-27 ENCOUNTER — Telehealth: Payer: Self-pay

## 2022-09-27 MED ORDER — PREDNISONE 20 MG PO TABS: ORAL_TABLET | ORAL | 0 refills | Status: AC

## 2022-09-27 NOTE — Telephone Encounter (Signed)
Caroline Kelly called to report that she has almost completed her antibiotic but she remains very congested and tight in her chest.  She is having very little cough but she is very tight.  When she does cough it is a dry cough.  She reports no fever.

## 2022-09-27 NOTE — Telephone Encounter (Signed)
Patient was informed that medication was sent to pharmacy 

## 2022-10-19 ENCOUNTER — Other Ambulatory Visit: Payer: Self-pay | Admitting: Physician Assistant

## 2022-11-22 ENCOUNTER — Ambulatory Visit (INDEPENDENT_AMBULATORY_CARE_PROVIDER_SITE_OTHER): Payer: BC Managed Care – PPO | Admitting: Physician Assistant

## 2022-11-22 ENCOUNTER — Encounter: Payer: Self-pay | Admitting: Physician Assistant

## 2022-11-22 VITALS — BP 120/64 | HR 72 | Temp 96.3°F | Resp 18 | Ht 62.0 in | Wt 187.0 lb

## 2022-11-22 DIAGNOSIS — J0100 Acute maxillary sinusitis, unspecified: Secondary | ICD-10-CM

## 2022-11-22 DIAGNOSIS — R051 Acute cough: Secondary | ICD-10-CM | POA: Diagnosis not present

## 2022-11-22 LAB — POC COVID19 BINAXNOW: SARS Coronavirus 2 Ag: NEGATIVE

## 2022-11-22 MED ORDER — CEFTRIAXONE SODIUM 1 G IJ SOLR
1.0000 g | Freq: Once | INTRAMUSCULAR | Status: AC
  Administered 2022-11-22: 1 g via INTRAMUSCULAR

## 2022-11-22 MED ORDER — CEFDINIR 300 MG PO CAPS: 300.0000 mg | ORAL_CAPSULE | Freq: Two times a day (BID) | ORAL | 0 refills | Status: DC

## 2022-11-22 NOTE — Progress Notes (Signed)
Acute Office Visit  Subjective:    Patient ID: Caroline Kelly, female    DOB: [birth year], 61 y.o.   MRN: [id number]  Chief Complaint  Patient presents with   Cough   Sinus Problem    HPI: Patient is in today for complaints of cough, congestion, sinus pressure and malaise - states symptoms have been going on this past week - complains of teeth hurting as well She was seen in January for clinical pneumonia and states she cleared mostly from that - has had some cough this time but more dry Denies fever Pt requests injection  Past Medical History:  Diagnosis Date   Allergic rhinitis    Allergy    GERD (gastroesophageal reflux disease)    Hyperlipidemia    Hypertension     Past Surgical History:  Procedure Laterality Date   CESAREAN SECTION     ENDOMETRIAL ABLATION  1989    Family History  Problem Relation Age of Onset   Heart failure Mother    Dementia Mother    Hyperlipidemia Father    Hypertension Father    CAD Father    Hypertension Sister    Hypertension Brother    Breast cancer Neg Hx     Social History   Socioeconomic History   Marital status: Married    Spouse name: Not on file   Number of children: Not on file   Years of education: Not on file   Highest education level: Not on file  Occupational History   Not on file  Tobacco Use   Smoking status: Former    Packs/day: .5    Types: Cigarettes    Quit date: 09/2017    Years since quitting: 5.2   Smokeless tobacco: Never  Substance and Sexual Activity   Alcohol use: Yes    Comment: twice monthly   Drug use: Never   Sexual activity: Not on file  Other Topics Concern   Not on file  Social History Narrative   Not on file   Social Determinants of Health   Financial Resource Strain: Low Risk  (11/09/2021)   Overall Financial Resource Strain (CARDIA)    Difficulty of Paying Living Expenses: Not hard at all  Food Insecurity: No Food Insecurity (11/09/2021)   Hunger Vital Sign    Worried About  Running Out of Food in the Last Year: Never true    Ran Out of Food in the Last Year: Never true  Transportation Needs: No Transportation Needs (11/09/2021)   PRAPARE - Hydrologist (Medical): No    Lack of Transportation (Non-Medical): No  Physical Activity: Sufficiently Active (11/09/2021)   Exercise Vital Sign    Days of Exercise per Week: 3 days    Minutes of Exercise per Session: 60 min  Stress: Stress Concern Present (11/09/2021)   [place]    Feeling of Stress : Very much  Social Connections: Moderately Integrated (11/09/2021)   Social Connection and Isolation Panel [NHANES]    Frequency of Communication with Friends and Family: More than three times a week    Frequency of Social Gatherings with Friends and Family: More than three times a week    Attends Religious Services: More than 4 times per year    Active Member of [employer] or Organizations: Yes    Attends Archivist Meetings: More than 4 times per year    Marital Status: Separated  Intimate Partner Violence:  Not At Risk (11/09/2021)   Humiliation, Afraid, Rape, and Kick questionnaire    Fear of Current or Ex-Partner: No    Emotionally Abused: No    Physically Abused: No    Sexually Abused: No    Outpatient Medications Prior to Visit  Medication Sig Dispense Refill   albuterol (VENTOLIN HFA) 108 (90 Base) MCG/ACT inhaler Inhale 2 puffs into the lungs every 6 (six) hours as needed for wheezing or shortness of breath. 8 g 0   ALLERGY RELIEF 10 MG tablet TAKE 1 TABLET BY MOUTH ONCE DAILY 90 tablet 1   B Complex Vitamins (VITAMIN B COMPLEX PO) Take by mouth.     calcium-vitamin D (OSCAL WITH D) 500-200 MG-UNIT tablet Take 1 tablet by mouth daily with breakfast.     chlorthalidone (HYGROTON) 25 MG tablet Take 1 tablet (25 mg total) by mouth daily. 90 tablet 1   Coenzyme Q10 (CO Q 10 PO) Take 1 tablet by mouth daily at 6 (six) AM.      levothyroxine (SYNTHROID) 25 MCG tablet TAKE 1 TABLET BY MOUTH ONCE DAILY. 90 tablet 0   lisinopril (ZESTRIL) 40 MG tablet Take 1 tablet (40 mg total) by mouth 2 (two) times daily. 180 tablet 0   LORazepam (ATIVAN) 0.5 MG tablet Take 1 tablet (0.5 mg total) by mouth 2 (two) times daily as needed for anxiety. 20 tablet 1   nitroGLYCERIN (NITROSTAT) 0.4 MG SL tablet Place 1 tablet (0.4 mg total) under the tongue every 5 (five) minutes as needed for chest pain. 25 tablet 3   Probiotic Product (PROBIOTIC-10 PO) Take 1 tablet by mouth daily.     atorvastatin (LIPITOR) 40 MG tablet TAKE ONE TABLET BY MOUTH EVERY DAY (Patient not taking: Reported on 11/22/2022) 90 tablet 0   amoxicillin-clavulanate (AUGMENTIN) 875-125 MG tablet Take 1 tablet by mouth 2 (two) times daily. 20 tablet 0   No facility-administered medications prior to visit.    Allergies  Allergen Reactions   Levofloxacin Hives   Latex Rash    Review of Systems    CONSTITUTIONAL: see HPI E/N/T: see HPI CARDIOVASCULAR: Negative for chest pain,  RESPIRATORY: see HPI GASTROINTESTINAL: Negative for abdominal pain, acid reflux symptoms, constipation, diarrhea, nausea and vomiting.  MSK: Negative for arthralgias and myalgias.  INTEGUMENTARY: Negative for rash.       Objective:   PHYSICAL EXAM:   VS: BP 120/64   Pulse 72   Temp (!) 96.3 F (35.7 C)   Resp 18   Ht 5\' 2"  (1.575 m)   Wt 187 lb (84.8 kg)   BMI 34.20 kg/m   GEN: Well nourished, well developed, in no acute distress  HEENT: normal external ears and nose - normal external auditory canals and TMS -  - Lips, Teeth and Gums - normal  Oropharynx - erythema/pnd Has frontal sinus tenderness Cardiac: RRR; no murmurs, rubs, Respiratory:  normal respiratory rate and pattern with no distress - normal breath sounds with no rales, rhonchi, wheezes or rubs -  Skin: warm and dry, no rash   Office Visit on 11/22/2022  Component Date Value Ref Range Status   SARS  Coronavirus 2 Ag 11/22/2022 Negative  Negative Final      Health Maintenance Due  Topic Date Due   COLONOSCOPY (Pts 45-48yrs Insurance coverage will need to be confirmed)  Never done   INFLUENZA VACCINE  04/03/2022   COVID-19 Vaccine (4 - 2023-24 season) 05/04/2022    There are no preventive care reminders to  display for this patient.   Lab Results  Component Value Date   TSH 1.990 07/05/2021   Lab Results  Component Value Date   WBC 5.8 12/28/2021   HGB 14.2 12/28/2021   HCT 41.6 12/28/2021   MCV 90 12/28/2021   PLT 232 12/28/2021   Lab Results  Component Value Date   NA 143 12/28/2021   K 3.8 12/28/2021   CO2 27 12/28/2021   GLUCOSE 73 12/28/2021   BUN 9 12/28/2021   CREATININE 0.78 12/28/2021   BILITOT 0.5 12/28/2021   ALKPHOS 62 12/28/2021   AST 30 12/28/2021   ALT 24 12/28/2021   PROT 6.9 12/28/2021   ALBUMIN 4.5 12/28/2021   CALCIUM 9.9 12/28/2021   EGFR 87 12/28/2021   Lab Results  Component Value Date   CHOL 160 10/11/2021   Lab Results  Component Value Date   HDL 55 10/11/2021   Lab Results  Component Value Date   LDLCALC 91 10/11/2021   Lab Results  Component Value Date   TRIG 71 10/11/2021   Lab Results  Component Value Date   CHOLHDL 2.9 10/11/2021   Lab Results  Component Value Date   HGBA1C 5.5 07/05/2021       Assessment & Plan:  Acute cough -     POC COVID-19 BinaxNow -     cefTRIAXone Sodium -     Cefdinir; Take 1 capsule (300 mg total) by mouth 2 (two) times daily.  Dispense: 20 capsule; Refill: 0  Acute non-recurrent maxillary sinusitis -     cefTRIAXone Sodium -     Cefdinir; Take 1 capsule (300 mg total) by mouth 2 (two) times daily.  Dispense: 20 capsule; Refill: 0     Meds ordered this encounter  Medications   cefTRIAXone (ROCEPHIN) injection 1 g   cefdinir (OMNICEF) 300 MG capsule    Sig: Take 1 capsule (300 mg total) by mouth 2 (two) times daily.    Dispense:  20 capsule    Refill:  0    Order Specific  Question:   Supervising Provider    AnswerShelton Silvas    Orders Placed This Encounter  Procedures   POC COVID-19     Follow-up: Return if symptoms worsen or fail to improve.  An After Visit Summary was printed and given to the patient.  Yetta Flock [facility] [phone]

## 2022-12-07 ENCOUNTER — Other Ambulatory Visit: Payer: Self-pay | Admitting: Family Medicine

## 2022-12-18 NOTE — Progress Notes (Signed)
Subjective:  Patient ID: [name], female    DOB: [birth year]  Age: 62 y.o. MRN: [id number]  Chief Complaint  Patient presents with   Hyperlipidemia   Hypertension    HPI Hypertension: On lisinopril 40 mg twice a day, chlorthalidone 25 mg daily as needed if bp is up. Patient has had to take the chlorthalidone daily for the last few days. Her bp has been up and she can tell because she gets a headache. SBP was 150s this past Saturday.   Hypothyroidism: Taking Levothyroxine 25 mg daily.  Hyperlipidemia: No taking medicine. Patient is not taking atorvastatin because she went off it while taking an antibiotic and never started it again.   Ears feel full. Feels dizzy when she leans over. Dizzy when she rolled over in bed,      12/28/2021   10:52 AM 10/11/2021    7:48 AM 11/17/2020    9:25 AM 10/25/2019   11:41 AM  Depression screen PHQ 2/9  Decreased Interest 0 1 0 0  Down, Depressed, Hopeless 0 1 0 0  PHQ - 2 Score 0 2 0 0  Altered sleeping  3    Tired, decreased energy  0    Change in appetite  1    Feeling bad or failure about yourself   0    Trouble concentrating  1    Moving slowly or fidgety/restless  0    Suicidal thoughts  0    PHQ-9 Score  7           11/17/2020    9:25 AM 12/28/2021   10:51 AM  Fall Risk  Falls in the past year? 0 0  Was there an injury with Fall? 0 0  Fall Risk Category Calculator 0 0  Fall Risk Category (Retired) Low Low  (RETIRED) Patient Fall Risk Level Low fall risk Low fall risk  Patient at Risk for Falls Due to No Fall Risks   Fall risk Follow up Falls evaluation completed       Review of Systems  Constitutional:  Negative for chills, fatigue and fever.  HENT:  Positive for congestion and ear pain. Negative for rhinorrhea and sore throat.   Respiratory:  Positive for cough. Negative for shortness of breath.        DYSPNEA ON EXERTION.  Cardiovascular:  Negative for chest pain.  Gastrointestinal:  Positive for nausea (with  dizziness). Negative for abdominal pain, constipation, diarrhea and vomiting.  Genitourinary:  Negative for dysuria, urgency and vaginal discharge.  Musculoskeletal:  Negative for back pain and myalgias.  Neurological:  Positive for dizziness and headaches. Negative for weakness and light-headedness.  Psychiatric/Behavioral:  Negative for dysphoric mood. The patient is not nervous/anxious.     Current Outpatient Medications on File Prior to Visit  Medication Sig Dispense Refill   albuterol (VENTOLIN HFA) 108 (90 Base) MCG/ACT inhaler Inhale 2 puffs into the lungs every 6 (six) hours as needed for wheezing or shortness of breath. 8 g 0   atorvastatin (LIPITOR) 40 MG tablet TAKE ONE TABLET BY MOUTH EVERY DAY 90 tablet 0   B Complex Vitamins (VITAMIN B COMPLEX PO) Take by mouth.     calcium-vitamin D (OSCAL WITH D) 500-200 MG-UNIT tablet Take 1 tablet by mouth daily with breakfast.     chlorthalidone (HYGROTON) 25 MG tablet Take 1 tablet (25 mg total) by mouth daily. (Patient taking differently: Take 12.5 mg by mouth daily as needed.) 90 tablet 1   Coenzyme Q10 (  CO Q 10 PO) Take 1 tablet by mouth daily at 6 (six) AM.     levothyroxine (SYNTHROID) 25 MCG tablet TAKE 1 TABLET BY MOUTH ONCE DAILY. 90 tablet 0   lisinopril (ZESTRIL) 40 MG tablet TAKE ONE TABLET BY MOUTH TWICE DAILY 180 tablet 0   LORazepam (ATIVAN) 0.5 MG tablet Take 1 tablet (0.5 mg total) by mouth 2 (two) times daily as needed for anxiety. 20 tablet 1   nitroGLYCERIN (NITROSTAT) 0.4 MG SL tablet Place 1 tablet (0.4 mg total) under the tongue every 5 (five) minutes as needed for chest pain. 25 tablet 3   Probiotic Product (PROBIOTIC-10 PO) Take 1 tablet by mouth daily.     ALLERGY RELIEF 10 MG tablet TAKE 1 TABLET BY MOUTH ONCE DAILY 90 tablet 1   No current facility-administered medications on file prior to visit.   Past Medical History:  Diagnosis Date   Allergic rhinitis    Allergy    GERD (gastroesophageal reflux disease)     Hyperlipidemia    Hypertension    Past Surgical History:  Procedure Laterality Date   CESAREAN SECTION     ENDOMETRIAL ABLATION  1989    Family History  Problem Relation Age of Onset   Heart failure Mother    Dementia Mother    Hyperlipidemia Father    Hypertension Father    CAD Father    Hypertension Sister    Hypertension Brother    Breast cancer Neg Hx    Social History   Socioeconomic History   Marital status: Married    Spouse name: Not on file   Number of children: Not on file   Years of education: Not on file   Highest education level: Not on file  Occupational History   Not on file  Tobacco Use   Smoking status: Former    Packs/day: .5    Types: Cigarettes    Quit date: 09/2017    Years since quitting: 5.2   Smokeless tobacco: Never  Substance and Sexual Activity   Alcohol use: Yes    Comment: twice monthly   Drug use: Never   Sexual activity: Not on file  Other Topics Concern   Not on file  Social History Narrative   Not on file   Social Determinants of Health   Financial Resource Strain: Low Risk  (11/09/2021)   Overall Financial Resource Strain (CARDIA)    Difficulty of Paying Living Expenses: Not hard at all  Food Insecurity: No Food Insecurity (11/09/2021)   Hunger Vital Sign    Worried About Running Out of Food in the Last Year: Never true    Ran Out of Food in the Last Year: Never true  Transportation Needs: No Transportation Needs (11/09/2021)   PRAPARE - Administrator, Civil Service (Medical): No    Lack of Transportation (Non-Medical): No  Physical Activity: Sufficiently Active (11/09/2021)   Exercise Vital Sign    Days of Exercise per Week: 3 days    Minutes of Exercise per Session: 60 min  Stress: Stress Concern Present (11/09/2021)   [employer] of Occupational Health - Occupational Stress Questionnaire    Feeling of Stress : Very much  Social Connections: Moderately Integrated (11/09/2021)   Social Connection and  Isolation Panel [NHANES]    Frequency of Communication with Friends and Family: More than three times a week    Frequency of Social Gatherings with Friends and Family: More than three times a week  Attends Religious Services: More than 4 times per year    Active Member of Clubs or Organizations: Yes    Attends Banker Meetings: More than 4 times per year    Marital Status: Separated    Objective:  BP 136/70   Pulse 60   Temp (!) 96.4 F (35.8 C)   Resp 16   Ht 5\' 2"  (1.575 m)   Wt 186 lb (84.4 kg)   BMI 34.02 kg/m      12/19/2022    7:31 AM 11/22/2022    2:48 PM 09/17/2022   10:36 AM  BP/Weight  Systolic BP 136 120 136  Diastolic BP 70 64 78  Wt. (Lbs) 186 187   BMI 34.02 kg/m2 34.2 kg/m2     Physical Exam Vitals reviewed.  Constitutional:      Appearance: Normal appearance.  HENT:     Right Ear: Tympanic membrane, ear canal and external ear normal.     Left Ear: Tympanic membrane, ear canal and external ear normal.     Nose: Congestion present.     Mouth/Throat:     Pharynx: Oropharynx is clear. No oropharyngeal exudate or posterior oropharyngeal erythema.  Cardiovascular:     Rate and Rhythm: Normal rate and regular rhythm.     Heart sounds: Normal heart sounds. No murmur heard. Pulmonary:     Effort: Pulmonary effort is normal. No respiratory distress.     Breath sounds: Normal breath sounds.  Lymphadenopathy:     Cervical: No cervical adenopathy.  Neurological:     Mental Status: She is alert and oriented to person, place, and time.     Comments: Negative Epley maneuver.  Psychiatric:        Mood and Affect: Mood normal.        Behavior: Behavior normal.     Diabetic Foot Exam - Simple   No data filed      Lab Results  Component Value Date   WBC 6.9 12/19/2022   HGB 15.4 12/19/2022   HCT 44.6 12/19/2022   PLT 262 12/19/2022   GLUCOSE 86 12/19/2022   CHOL 270 (H) 12/19/2022   TRIG 153 (H) 12/19/2022   HDL 64 12/19/2022   LDLCALC  178 (H) 12/19/2022   ALT 26 12/19/2022   AST 29 12/19/2022   NA 139 12/19/2022   K 3.8 12/19/2022   CL 96 12/19/2022   CREATININE 0.98 12/19/2022   BUN 15 12/19/2022   CO2 28 12/19/2022   TSH 3.770 12/19/2022   HGBA1C 5.5 07/05/2021      Assessment & Plan:    Essential hypertension, benign Assessment & Plan: The current medical regimen is effective;  continue present plan and medications. ON lisinopril 40 mg one twice daily. Continue taking chlorthalidone 25 mg 1/2 daily. If sbp is still above 150 take chlorthalidone 25 mg daily.  Orders: -     Comprehensive metabolic panel -     CBC with Differential/Platelet -     Cardiovascular Risk Assessment  Acquired hypothyroidism Assessment & Plan: Previously well controlled Continue Synthroid at current dose  Recheck TSH and adjust Synthroid as indicated     Orders: -     TSH  Mixed hyperlipidemia Assessment & Plan: Poorly controlled.   Continue to work on eating a healthy diet and exercise.  Labs drawn today.    Orders: -     Lipid panel  Acute non-recurrent sinusitis of other sinus Assessment & Plan: Augmentin. Flonase.    Orders: -  Meclizine HCl; Take 1 tablet (25 mg total) by mouth 3 (three) times daily as needed for dizziness.  Dispense: 40 tablet; Refill: 0 -     cefTRIAXone Sodium -     Amoxicillin-Pot Clavulanate; Take 1 tablet by mouth 2 (two) times daily.  Dispense: 20 tablet; Refill: 0 -     Ondansetron HCl; Take 1 tablet (4 mg total) by mouth every 8 (eight) hours as needed for nausea or vomiting.  Dispense: 20 tablet; Refill: 0 -     Fluticasone Propionate; Place 2 sprays into both nostrils daily.  Dispense: 16 g; Refill: 6  Gastroesophageal reflux disease without esophagitis -     Omeprazole; Take 1 capsule (40 mg total) by mouth daily.  Dispense: 30 capsule; Refill: 3  Vertigo Assessment & Plan: Prescription: meclizine.and ondansetron.      Meds ordered this encounter  Medications    meclizine (ANTIVERT) 25 MG tablet    Sig: Take 1 tablet (25 mg total) by mouth 3 (three) times daily as needed for dizziness.    Dispense:  40 tablet    Refill:  0   cefTRIAXone (ROCEPHIN) injection 1 g   amoxicillin-clavulanate (AUGMENTIN) 875-125 MG tablet    Sig: Take 1 tablet by mouth 2 (two) times daily.    Dispense:  20 tablet    Refill:  0   ondansetron (ZOFRAN) 4 MG tablet    Sig: Take 1 tablet (4 mg total) by mouth every 8 (eight) hours as needed for nausea or vomiting.    Dispense:  20 tablet    Refill:  0   fluticasone (FLONASE) 50 MCG/ACT nasal spray    Sig: Place 2 sprays into both nostrils daily.    Dispense:  16 g    Refill:  6   omeprazole (PRILOSEC) 40 MG capsule    Sig: Take 1 capsule (40 mg total) by mouth daily.    Dispense:  30 capsule    Refill:  3    Orders Placed This Encounter  Procedures   Comprehensive metabolic panel   Lipid panel   CBC with Differential/Platelet   TSH   Cardiovascular Risk Assessment     Follow-up: Return in about 3 months (around 03/20/2023) for chronic fasting.   I,Marla I Leal-Borjas,acting as a scribe for Blane Ohara, MD.,have documented all relevant documentation on the behalf of Blane Ohara, MD,as directed by  Blane Ohara, MD while in the presence of Blane Ohara, MD.   An After Visit Summary was printed and given to the patient.  I attest that I have reviewed this visit and agree with the plan scribed by my staff.   Blane Ohara, MD [facility] [phone]

## 2022-12-19 ENCOUNTER — Ambulatory Visit (INDEPENDENT_AMBULATORY_CARE_PROVIDER_SITE_OTHER): Payer: BC Managed Care – PPO | Admitting: Family Medicine

## 2022-12-19 ENCOUNTER — Encounter: Payer: Self-pay | Admitting: Family Medicine

## 2022-12-19 VITALS — BP 136/70 | HR 60 | Temp 96.4°F | Resp 16 | Ht 62.0 in | Wt 186.0 lb

## 2022-12-19 DIAGNOSIS — I10 Essential (primary) hypertension: Secondary | ICD-10-CM | POA: Diagnosis not present

## 2022-12-19 DIAGNOSIS — E039 Hypothyroidism, unspecified: Secondary | ICD-10-CM | POA: Diagnosis not present

## 2022-12-19 DIAGNOSIS — E782 Mixed hyperlipidemia: Secondary | ICD-10-CM

## 2022-12-19 DIAGNOSIS — K219 Gastro-esophageal reflux disease without esophagitis: Secondary | ICD-10-CM

## 2022-12-19 DIAGNOSIS — R42 Dizziness and giddiness: Secondary | ICD-10-CM

## 2022-12-19 DIAGNOSIS — J018 Other acute sinusitis: Secondary | ICD-10-CM

## 2022-12-19 MED ORDER — AMOXICILLIN-POT CLAVULANATE 875-125 MG PO TABS
1.0000 | ORAL_TABLET | Freq: Two times a day (BID) | ORAL | 0 refills | Status: DC
Start: 2022-12-19 — End: 2023-01-07

## 2022-12-19 MED ORDER — CEFTRIAXONE SODIUM 1 G IJ SOLR
1.0000 g | Freq: Once | INTRAMUSCULAR | Status: AC
Start: 2022-12-19 — End: 2022-12-19
  Administered 2022-12-19: 1 g via INTRAMUSCULAR

## 2022-12-19 MED ORDER — ONDANSETRON HCL 4 MG PO TABS: 4.0000 mg | ORAL_TABLET | Freq: Three times a day (TID) | ORAL | 0 refills | Status: DC | PRN

## 2022-12-19 MED ORDER — MECLIZINE HCL 25 MG PO TABS
25.0000 mg | ORAL_TABLET | Freq: Three times a day (TID) | ORAL | 0 refills | Status: DC | PRN
Start: 2022-12-19 — End: 2023-08-16

## 2022-12-19 MED ORDER — OMEPRAZOLE 40 MG PO CPDR
40.0000 mg | DELAYED_RELEASE_CAPSULE | Freq: Every day | ORAL | 3 refills | Status: DC
Start: 2022-12-19 — End: 2023-08-16

## 2022-12-19 MED ORDER — FLUTICASONE PROPIONATE 50 MCG/ACT NA SUSP
2.0000 | Freq: Every day | NASAL | 6 refills | Status: DC
Start: 2022-12-19 — End: 2023-08-16

## 2022-12-19 NOTE — Patient Instructions (Addendum)
Prescription:  Meclizine for dizziness.  Augmentin for sinus infection Ondansetron for nausea.

## 2022-12-20 DIAGNOSIS — J019 Acute sinusitis, unspecified: Secondary | ICD-10-CM | POA: Insufficient documentation

## 2022-12-20 DIAGNOSIS — R42 Dizziness and giddiness: Secondary | ICD-10-CM | POA: Insufficient documentation

## 2022-12-20 LAB — TSH: TSH: 3.77 u[IU]/mL (ref 0.450–4.500)

## 2022-12-20 LAB — CBC WITH DIFFERENTIAL/PLATELET
Basophils Absolute: 0.1 10*3/uL (ref 0.0–0.2)
Basos: 1 %
EOS (ABSOLUTE): 0.2 10*3/uL (ref 0.0–0.4)
Eos: 2 %
Hematocrit: 44.6 % (ref 34.0–46.6)
Hemoglobin: 15.4 g/dL (ref 11.1–15.9)
Immature Grans (Abs): 0 10*3/uL (ref 0.0–0.1)
Immature Granulocytes: 0 %
Lymphocytes Absolute: 2.4 10*3/uL (ref 0.7–3.1)
Lymphs: 34 %
MCH: 30.5 pg (ref 26.6–33.0)
MCHC: 34.5 g/dL (ref 31.5–35.7)
MCV: 88 fL (ref 79–97)
Monocytes Absolute: 0.7 10*3/uL (ref 0.1–0.9)
Monocytes: 10 %
Neutrophils Absolute: 3.6 10*3/uL (ref 1.4–7.0)
Neutrophils: 53 %
Platelets: 262 10*3/uL (ref 150–450)
RBC: 5.05 x10E6/uL (ref 3.77–5.28)
RDW: 13.8 % (ref 11.7–15.4)
WBC: 6.9 10*3/uL (ref 3.4–10.8)

## 2022-12-20 LAB — COMPREHENSIVE METABOLIC PANEL
ALT: 26 IU/L (ref 0–32)
AST: 29 IU/L (ref 0–40)
Albumin/Globulin Ratio: 2 (ref 1.2–2.2)
Albumin: 4.7 g/dL (ref 3.8–4.9)
Alkaline Phosphatase: 70 IU/L (ref 44–121)
BUN/Creatinine Ratio: 15 (ref 12–28)
BUN: 15 mg/dL (ref 8–27)
Bilirubin Total: 0.4 mg/dL (ref 0.0–1.2)
CO2: 28 mmol/L (ref 20–29)
Calcium: 10.7 mg/dL — ABNORMAL HIGH (ref 8.7–10.3)
Chloride: 96 mmol/L (ref 96–106)
Creatinine, Ser: 0.98 mg/dL (ref 0.57–1.00)
Globulin, Total: 2.4 g/dL (ref 1.5–4.5)
Glucose: 86 mg/dL (ref 70–99)
Potassium: 3.8 mmol/L (ref 3.5–5.2)
Sodium: 139 mmol/L (ref 134–144)
Total Protein: 7.1 g/dL (ref 6.0–8.5)
eGFR: 66 mL/min/{1.73_m2} (ref >59)

## 2022-12-20 LAB — LIPID PANEL
Chol/HDL Ratio: 4.2 ratio (ref 0.0–4.4)
Cholesterol, Total: 270 mg/dL — ABNORMAL HIGH (ref 100–199)
HDL: 64 mg/dL (ref >39)
LDL Chol Calc (NIH): 178 mg/dL — ABNORMAL HIGH (ref 0–99)
Triglycerides: 153 mg/dL — ABNORMAL HIGH (ref 0–149)
VLDL Cholesterol Cal: 28 mg/dL (ref 5–40)

## 2022-12-20 LAB — CARDIOVASCULAR RISK ASSESSMENT

## 2022-12-20 NOTE — Assessment & Plan Note (Addendum)
Augmentin Flonase 

## 2022-12-20 NOTE — Assessment & Plan Note (Signed)
Previously well controlled Continue Synthroid at current dose  Recheck TSH and adjust Synthroid as indicated   

## 2022-12-20 NOTE — Assessment & Plan Note (Signed)
Prescription: meclizine.and ondansetron.

## 2022-12-20 NOTE — Assessment & Plan Note (Signed)
Poorly controlled Continue to work on eating a healthy diet and exercise.  Labs drawn today.  

## 2022-12-20 NOTE — Assessment & Plan Note (Signed)
The current medical regimen is effective;  continue present plan and medications. ON lisinopril 40 mg one twice daily. Continue taking chlorthalidone 25 mg 1/2 daily. If sbp is still above 150 take chlorthalidone 25 mg daily.

## 2022-12-31 ENCOUNTER — Other Ambulatory Visit: Payer: Self-pay | Admitting: Family Medicine

## 2022-12-31 ENCOUNTER — Telehealth: Payer: Self-pay

## 2022-12-31 DIAGNOSIS — R42 Dizziness and giddiness: Secondary | ICD-10-CM

## 2022-12-31 DIAGNOSIS — H938X3 Other specified disorders of ear, bilateral: Secondary | ICD-10-CM

## 2022-12-31 NOTE — Telephone Encounter (Signed)
Sent. Dr. Glee Lashomb  

## 2022-12-31 NOTE — Telephone Encounter (Signed)
Patient is calling requesting a referral to [facility]. She states that her ears are still stopped up and this has been going on since January she stated and she is still having vertigo really badly too. Can you put this referral in for patient?

## 2023-01-01 NOTE — Telephone Encounter (Signed)
Patient notified that Dr. Sedalia Muta is going to do a referral

## 2023-01-02 DIAGNOSIS — M9901 Segmental and somatic dysfunction of cervical region: Secondary | ICD-10-CM | POA: Diagnosis not present

## 2023-01-02 DIAGNOSIS — M7912 Myalgia of auxiliary muscles, head and neck: Secondary | ICD-10-CM | POA: Diagnosis not present

## 2023-01-02 DIAGNOSIS — M9902 Segmental and somatic dysfunction of thoracic region: Secondary | ICD-10-CM | POA: Diagnosis not present

## 2023-01-07 ENCOUNTER — Ambulatory Visit: Payer: BC Managed Care – PPO | Admitting: Family Medicine

## 2023-01-07 ENCOUNTER — Encounter: Payer: Self-pay | Admitting: Family Medicine

## 2023-01-07 VITALS — BP 130/70 | HR 72 | Temp 96.9°F | Resp 16 | Ht 62.0 in | Wt 187.0 lb

## 2023-01-07 DIAGNOSIS — H6993 Unspecified Eustachian tube disorder, bilateral: Secondary | ICD-10-CM | POA: Diagnosis not present

## 2023-01-07 MED ORDER — ATORVASTATIN CALCIUM 40 MG PO TABS: 40.0000 mg | ORAL_TABLET | Freq: Every day | ORAL | 1 refills | Status: DC

## 2023-01-07 NOTE — Assessment & Plan Note (Signed)
Trial on zyrtec (ceterizine) once at night. Stop loratidine.  Continue flonase   Consider addition of singulair if not improving over the next week. Call and let us know.  

## 2023-01-07 NOTE — Progress Notes (Signed)
Acute Office Visit  Subjective:    Patient ID: Caroline Kelly, female    DOB: 1961/06/05, 62 y.o.   MRN: [id number]  Chief Complaint  Patient presents with   Ear Fullness    Bilaterally     HPI: Patient is in today for fullness of her ears with occasional vertigo.  She completed her antibiotics (three courses since January) and attempted to get in with ENT but she was offered the end of June.   ON flonase -   Past Medical History:  Diagnosis Date   Allergic rhinitis    Allergy    GERD (gastroesophageal reflux disease)    Hyperlipidemia    Hypertension     Past Surgical History:  Procedure Laterality Date   CESAREAN SECTION     ENDOMETRIAL ABLATION  1989    Family History  Problem Relation Age of Onset   Heart failure Mother    Dementia Mother    Hyperlipidemia Father    Hypertension Father    CAD Father    Hypertension Sister    Hypertension Brother    Breast cancer Neg Hx     Social History   Socioeconomic History   Marital status: Married    Spouse name: Not on file   Number of children: Not on file   Years of education: Not on file   Highest education level: Not on file  Occupational History   Not on file  Tobacco Use   Smoking status: Former    Packs/day: .5    Types: Cigarettes    Quit date: 09/2017    Years since quitting: 5.3   Smokeless tobacco: Never  Substance and Sexual Activity   Alcohol use: Yes    Comment: twice monthly   Drug use: Never   Sexual activity: Not on file  Other Topics Concern   Not on file  Social History Narrative   Not on file   Social Determinants of Health   Financial Resource Strain: Low Risk  (11/09/2021)   Overall Financial Resource Strain (CARDIA)    Difficulty of Paying Living Expenses: Not hard at all  Food Insecurity: No Food Insecurity (11/09/2021)   Hunger Vital Sign    Worried About Running Out of Food in the Last Year: Never true    Ran Out of Food in the Last Year: Never true  Transportation  Needs: No Transportation Needs (11/09/2021)   PRAPARE - Administrator, Civil Service (Medical): No    Lack of Transportation (Non-Medical): No  Physical Activity: Sufficiently Active (11/09/2021)   Exercise Vital Sign    Days of Exercise per Week: 3 days    Minutes of Exercise per Session: 60 min  Stress: Stress Concern Present (11/09/2021)   [employer] of Occupational Health - Occupational Stress Questionnaire    Feeling of Stress : Very much  Social Connections: Moderately Integrated (11/09/2021)   Social Connection and Isolation Panel [NHANES]    Frequency of Communication with Friends and Family: More than three times a week    Frequency of Social Gatherings with Friends and Family: More than three times a week    Attends Religious Services: More than 4 times per year    Active Member of [employer] or Organizations: Yes    Attends Banker Meetings: More than 4 times per year    Marital Status: Separated  Intimate Partner Violence: Not At Risk (11/09/2021)   Humiliation, Afraid, Rape, and Kick questionnaire    Fear  of Current or Ex-Partner: No    Emotionally Abused: No    Physically Abused: No    Sexually Abused: No    Outpatient Medications Prior to Visit  Medication Sig Dispense Refill   albuterol (VENTOLIN HFA) 108 (90 Base) MCG/ACT inhaler Inhale 2 puffs into the lungs every 6 (six) hours as needed for wheezing or shortness of breath. 8 g 0   ALLERGY RELIEF 10 MG tablet TAKE 1 TABLET BY MOUTH ONCE DAILY 90 tablet 1   chlorthalidone (HYGROTON) 25 MG tablet Take 1 tablet (25 mg total) by mouth daily. (Patient taking differently: Take 12.5 mg by mouth daily as needed.) 90 tablet 1   Coenzyme Q10 (CO Q 10 PO) Take 1 tablet by mouth daily at 6 (six) AM.     fluticasone (FLONASE) 50 MCG/ACT nasal spray Place 2 sprays into both nostrils daily. 16 g 6   levothyroxine (SYNTHROID) 25 MCG tablet TAKE 1 TABLET BY MOUTH ONCE DAILY. 90 tablet 0   lisinopril (ZESTRIL) 40  MG tablet TAKE ONE TABLET BY MOUTH TWICE DAILY 180 tablet 0   LORazepam (ATIVAN) 0.5 MG tablet Take 1 tablet (0.5 mg total) by mouth 2 (two) times daily as needed for anxiety. 20 tablet 1   meclizine (ANTIVERT) 25 MG tablet Take 1 tablet (25 mg total) by mouth 3 (three) times daily as needed for dizziness. 40 tablet 0   nitroGLYCERIN (NITROSTAT) 0.4 MG SL tablet Place 1 tablet (0.4 mg total) under the tongue every 5 (five) minutes as needed for chest pain. 25 tablet 3   omeprazole (PRILOSEC) 40 MG capsule Take 1 capsule (40 mg total) by mouth daily. 30 capsule 3   ondansetron (ZOFRAN) 4 MG tablet Take 1 tablet (4 mg total) by mouth every 8 (eight) hours as needed for nausea or vomiting. 20 tablet 0   Probiotic Product (PROBIOTIC-10 PO) Take 1 tablet by mouth daily.     amoxicillin-clavulanate (AUGMENTIN) 875-125 MG tablet Take 1 tablet by mouth 2 (two) times daily. 20 tablet 0   atorvastatin (LIPITOR) 40 MG tablet TAKE ONE TABLET BY MOUTH EVERY DAY 90 tablet 0   B Complex Vitamins (VITAMIN B COMPLEX PO) Take by mouth.     calcium-vitamin D (OSCAL WITH D) 500-200 MG-UNIT tablet Take 1 tablet by mouth daily with breakfast.     No facility-administered medications prior to visit.    Allergies  Allergen Reactions   Levofloxacin Hives   Latex Rash    Review of Systems  Constitutional:  Negative for chills, fatigue and fever.  HENT:  Positive for congestion. Negative for rhinorrhea and sore throat.        Fullness in both ears R> L.    Respiratory:  Negative for cough and shortness of breath.   Cardiovascular:  Negative for chest pain.  Gastrointestinal:  Negative for abdominal pain, constipation, diarrhea, nausea and vomiting.  Genitourinary:  Negative for dysuria and urgency.  Musculoskeletal:  Negative for back pain and myalgias.  Neurological:  Positive for dizziness. Negative for weakness, light-headedness and headaches.  Psychiatric/Behavioral:  Negative for dysphoric mood. The patient  is not nervous/anxious.        Objective:        01/07/2023    9:55 AM 12/19/2022    7:31 AM 11/22/2022    2:48 PM  Vitals with BMI  Height 5\' 2"  5\' 2"  5\' 2"   Weight 187 lbs 186 lbs 187 lbs  BMI 34.19 34.01 34.19  Systolic 130 136 098  Diastolic 70 70 64  Pulse 72 60 72    No data found.   Physical Exam Vitals reviewed.  Constitutional:      Appearance: Normal appearance.  HENT:     Right Ear: Tympanic membrane, ear canal and external ear normal.     Left Ear: Tympanic membrane, ear canal and external ear normal.     Nose: Congestion (pale) present.     Mouth/Throat:     Pharynx: No oropharyngeal exudate.  Cardiovascular:     Rate and Rhythm: Normal rate and regular rhythm.     Heart sounds: Normal heart sounds. No murmur heard. Pulmonary:     Effort: Pulmonary effort is normal. No respiratory distress.     Breath sounds: Normal breath sounds.  Neurological:     Mental Status: She is alert and oriented to person, place, and time.  Psychiatric:        Mood and Affect: Mood normal.        Behavior: Behavior normal.     Health Maintenance Due  Topic Date Due   COLONOSCOPY (Pts 45-66yrs Insurance coverage will need to be confirmed)  Never done   COVID-19 Vaccine (4 - 2023-24 season) 05/04/2022   MAMMOGRAM  12/28/2022    There are no preventive care reminders to display for this patient.   Lab Results  Component Value Date   TSH 3.770 12/19/2022   Lab Results  Component Value Date   WBC 6.9 12/19/2022   HGB 15.4 12/19/2022   HCT 44.6 12/19/2022   MCV 88 12/19/2022   PLT 262 12/19/2022   Lab Results  Component Value Date   NA 139 12/19/2022   K 3.8 12/19/2022   CO2 28 12/19/2022   GLUCOSE 86 12/19/2022   BUN 15 12/19/2022   CREATININE 0.98 12/19/2022   BILITOT 0.4 12/19/2022   ALKPHOS 70 12/19/2022   AST 29 12/19/2022   ALT 26 12/19/2022   PROT 7.1 12/19/2022   ALBUMIN 4.7 12/19/2022   CALCIUM 10.7 (H) 12/19/2022   EGFR 66 12/19/2022   Lab  Results  Component Value Date   CHOL 270 (H) 12/19/2022   Lab Results  Component Value Date   HDL 64 12/19/2022   Lab Results  Component Value Date   LDLCALC 178 (H) 12/19/2022   Lab Results  Component Value Date   TRIG 153 (H) 12/19/2022   Lab Results  Component Value Date   CHOLHDL 4.2 12/19/2022   Lab Results  Component Value Date   HGBA1C 5.5 07/05/2021       Assessment & Plan:  Dysfunction of both eustachian tubes Assessment & Plan: Trial on zyrtec (ceterizine) once at night. Stop loratidine.  Continue flonase   Consider addition of singulair if not improving over the next week. Call and let us know.    Other orders -     Atorvastatin Calcium; Take 1 tablet (40 mg total) by mouth daily.  Dispense: 90 tablet; Refill: 1     Meds ordered this encounter  Medications   atorvastatin (LIPITOR) 40 MG tablet    Sig: Take 1 tablet (40 mg total) by mouth daily.    Dispense:  90 tablet    Refill:  1    No orders of the defined types were placed in this encounter.    Follow-up: Return in about 3 months (around 04/09/2023) for chronic fasting.  An After Visit Summary was printed and given to the patient.  Blane Ohara, MD [facility] [phone]

## 2023-01-07 NOTE — Patient Instructions (Signed)
Trial on zyrtec (ceterizine) once at night. Stop loratidine.  Continue flonase   Consider addition of singulair if not improving over the next week. Call and let us know.

## 2023-01-09 DIAGNOSIS — M7912 Myalgia of auxiliary muscles, head and neck: Secondary | ICD-10-CM | POA: Diagnosis not present

## 2023-01-09 DIAGNOSIS — M9902 Segmental and somatic dysfunction of thoracic region: Secondary | ICD-10-CM | POA: Diagnosis not present

## 2023-01-09 DIAGNOSIS — M9901 Segmental and somatic dysfunction of cervical region: Secondary | ICD-10-CM | POA: Diagnosis not present

## 2023-01-19 ENCOUNTER — Other Ambulatory Visit: Payer: Self-pay | Admitting: Family Medicine

## 2023-02-11 ENCOUNTER — Telehealth: Payer: Self-pay | Admitting: *Deleted

## 2023-02-11 NOTE — Patient Outreach (Signed)
  Care Coordination   Initial Visit Note   02/11/2023 Name: CARMELITE VIOLET MRN: [id number] DOB: 05/28/1961  [name] is a 62 y.o. year old female who sees Cox, Kirsten, MD for primary care. I spoke with  [name] by phone today.  What matters to the patients health and wellness today?  Pt declines the need for Care Coordination support at this time. CSW advised her of the services offered and to reach out if needs arise by calling back or through her PCP office.     Goals Addressed   None     SDOH assessments and interventions completed:  No     Care Coordination Interventions:  No, not indicated   Follow up plan: No further intervention required.   Encounter Outcome:  Pt. Visit Completed

## 2023-02-26 ENCOUNTER — Other Ambulatory Visit: Payer: Self-pay | Admitting: Family Medicine

## 2023-02-27 ENCOUNTER — Other Ambulatory Visit: Payer: Self-pay | Admitting: Family Medicine

## 2023-04-03 NOTE — Progress Notes (Signed)
Subjective:  Patient ID: [name], female    DOB: May 18, 1961  Age: 62 y.o. MRN: [id number]  Chief Complaint  Patient presents with   Medical Management of Chronic Issues    HPI Hypertension: On lisinopril 40 mg twice a day, chlorthalidone 25 mg 1/2 pill daily as needed if bp is up.     Hypothyroidism: Taking Levothyroxine 25 mg daily.   Hyperlipidemia: Lipitor 40 mg daily   GERD: Omeprazole 40 mg daily.  Patient reports abdominal pain, diarrhea, nausea and vomiting.  This happened last week.  She still have some abdominal bloating.  No dysuria but she has had increased frequency and dysuria.     04/04/2023    8:02 AM 12/28/2021   10:52 AM 10/11/2021    7:48 AM 11/17/2020    9:25 AM 10/25/2019   11:41 AM  Depression screen PHQ 2/9  Decreased Interest 0 0 1 0 0  Down, Depressed, Hopeless 0 0 1 0 0  PHQ - 2 Score 0 0 2 0 0  Altered sleeping 1  3    Tired, decreased energy 0  0    Change in appetite 0  1    Feeling bad or failure about yourself  0  0    Trouble concentrating 0  1    Moving slowly or fidgety/restless 0  0    Suicidal thoughts 0  0    PHQ-9 Score 1  7    Difficult doing work/chores Not difficult at all            04/04/2023    8:02 AM  Fall Risk   Falls in the past year? 0  Number falls in past yr: 0  Injury with Fall? 0  Risk for fall due to : No Fall Risks  Follow up Falls evaluation completed;Falls prevention discussed    Patient Care Team: , Fritzi Mandes, MD as PCP - General (Family Medicine) Thomasene Ripple, DO as PCP - Cardiology (Cardiology)   Review of Systems  Constitutional:  Negative for chills, fatigue and fever.  HENT:  Negative for congestion, ear pain, rhinorrhea and sore throat.   Respiratory:  Negative for cough and shortness of breath.   Cardiovascular:  Negative for chest pain.  Gastrointestinal:  Positive for abdominal pain, diarrhea, nausea and vomiting. Negative for constipation.       Episodes of abdominal bloating.    Genitourinary:  Positive for dysuria. Negative for urgency.  Musculoskeletal:  Negative for back pain and myalgias.  Neurological:  Positive for numbness (left thigh). Negative for dizziness, weakness, light-headedness and headaches.  Psychiatric/Behavioral:  Negative for dysphoric mood. The patient is not nervous/anxious.     Current Outpatient Medications on File Prior to Visit  Medication Sig Dispense Refill   albuterol (VENTOLIN HFA) 108 (90 Base) MCG/ACT inhaler Inhale 2 puffs into the lungs every 6 (six) hours as needed for wheezing or shortness of breath. 8 g 0   ALLERGY RELIEF 10 MG tablet TAKE 1 TABLET BY MOUTH ONCE DAILY 90 tablet 1   atorvastatin (LIPITOR) 40 MG tablet Take 1 tablet (40 mg total) by mouth daily. 90 tablet 1   chlorthalidone (HYGROTON) 25 MG tablet Take 1 tablet (25 mg total) by mouth daily. (Patient taking differently: Take 12.5 mg by mouth daily as needed.) 90 tablet 1   Coenzyme Q10 (CO Q 10 PO) Take 1 tablet by mouth daily at 6 (six) AM.     fluticasone (FLONASE) 50 MCG/ACT nasal spray Place 2  sprays into both nostrils daily. 16 g 6   levothyroxine (SYNTHROID) 25 MCG tablet TAKE ONE TABLET BY MOUTH EVERY DAY 90 tablet 0   lisinopril (ZESTRIL) 40 MG tablet TAKE ONE TABLET BY MOUTH TWICE DAILY 180 tablet 0   LORazepam (ATIVAN) 0.5 MG tablet TAKE 1 TABLET BY MOUTH TWICE DAILY AS NEEDED FOR ANXIETY. 20 tablet 1   meclizine (ANTIVERT) 25 MG tablet Take 1 tablet (25 mg total) by mouth 3 (three) times daily as needed for dizziness. 40 tablet 0   omeprazole (PRILOSEC) 40 MG capsule Take 1 capsule (40 mg total) by mouth daily. 30 capsule 3   ondansetron (ZOFRAN) 4 MG tablet Take 1 tablet (4 mg total) by mouth every 8 (eight) hours as needed for nausea or vomiting. 20 tablet 0   Probiotic Product (PROBIOTIC-10 PO) Take 1 tablet by mouth daily.     No current facility-administered medications on file prior to visit.   Past Medical History:  Diagnosis Date   Allergic  rhinitis    Allergy    GERD (gastroesophageal reflux disease)    Hyperlipidemia    Hypertension    Past Surgical History:  Procedure Laterality Date   CESAREAN SECTION     ENDOMETRIAL ABLATION  1989    Family History  Problem Relation Age of Onset   Heart failure Mother    Dementia Mother    Hyperlipidemia Father    Hypertension Father    CAD Father    Hypertension Sister    Hypertension Brother    Breast cancer Neg Hx    Social History   Socioeconomic History   Marital status: Married    Spouse name: Not on file   Number of children: Not on file   Years of education: Not on file   Highest education level: Not on file  Occupational History   Not on file  Tobacco Use   Smoking status: Former    Current packs/day: 0.00    Types: Cigarettes    Quit date: 09/2017    Years since quitting: 5.5   Smokeless tobacco: Never  Substance and Sexual Activity   Alcohol use: Yes    Comment: twice monthly   Drug use: Never   Sexual activity: Not on file  Other Topics Concern   Not on file  Social History Narrative   Not on file   Social Determinants of Health   Financial Resource Strain: Low Risk  (11/09/2021)   Overall Financial Resource Strain (CARDIA)    Difficulty of Paying Living Expenses: Not hard at all  Food Insecurity: No Food Insecurity (11/09/2021)   Hunger Vital Sign    Worried About Running Out of Food in the Last Year: Never true    Ran Out of Food in the Last Year: Never true  Transportation Needs: No Transportation Needs (11/09/2021)   PRAPARE - Administrator, Civil Service (Medical): No    Lack of Transportation (Non-Medical): No  Physical Activity: Sufficiently Active (11/09/2021)   Exercise Vital Sign    Days of Exercise per Week: 3 days    Minutes of Exercise per Session: 60 min  Stress: Stress Concern Present (11/09/2021)   [employer] of Occupational Health - Occupational Stress Questionnaire    Feeling of Stress : Very much  Social  Connections: Moderately Integrated (11/09/2021)   Social Connection and Isolation Panel [NHANES]    Frequency of Communication with Friends and Family: More than three times a week    Frequency of  Social Gatherings with Friends and Family: More than three times a week    Attends Religious Services: More than 4 times per year    Active Member of [employer] or Organizations: Yes    Attends Engineer, structural: More than 4 times per year    Marital Status: Separated    Objective:  BP (!) 144/76   Pulse 68   Temp (!) 96.7 F (35.9 C)   Resp 18   Ht 5\' 3"  (1.6 m)   Wt 188 lb (85.3 kg)   BMI 33.30 kg/m      04/04/2023    7:58 AM 01/07/2023    9:55 AM 12/19/2022    7:31 AM  BP/Weight  Systolic BP 144 130 136  Diastolic BP 76 70 70  Wt. (Lbs) 188 187 186  BMI 33.3 kg/m2 34.2 kg/m2 34.02 kg/m2    Physical Exam Vitals reviewed.  Constitutional:      Appearance: Normal appearance. She is normal weight.  Neck:     Vascular: No carotid bruit.  Cardiovascular:     Rate and Rhythm: Normal rate and regular rhythm.     Heart sounds: Normal heart sounds.  Pulmonary:     Effort: Pulmonary effort is normal. No respiratory distress.     Breath sounds: Normal breath sounds.  Abdominal:     General: Abdomen is flat. Bowel sounds are normal.     Palpations: Abdomen is soft.     Tenderness: There is abdominal tenderness (Lower).  Neurological:     Mental Status: She is alert and oriented to person, place, and time.  Psychiatric:        Mood and Affect: Mood normal.        Behavior: Behavior normal.     Diabetic Foot Exam - Simple   No data filed      Lab Results  Component Value Date   WBC 5.2 04/04/2023   HGB 14.8 04/04/2023   HCT 43.5 04/04/2023   PLT 253 04/04/2023   GLUCOSE 89 04/04/2023   CHOL 192 04/04/2023   TRIG 116 04/04/2023   HDL 53 04/04/2023   LDLCALC 118 (H) 04/04/2023   ALT 33 (H) 04/04/2023   AST 39 04/04/2023   NA 141 04/04/2023   K 4.6 04/04/2023    CL 104 04/04/2023   CREATININE 0.93 04/04/2023   BUN 10 04/04/2023   CO2 25 04/04/2023   TSH 3.770 12/19/2022   HGBA1C 5.5 07/05/2021      Assessment & Plan:    Essential hypertension, benign Assessment & Plan: The current medical regimen is effective;  continue present plan and medications. ON lisinopril 40 mg one twice daily. Continue taking chlorthalidone 25 mg 1/2 daily. If sbp is still above 150 take chlorthalidone 25 mg daily.  Orders: -     CBC with Differential/Platelet -     CMP14+EGFR  Acquired hypothyroidism Assessment & Plan: Previously well controlled Continue Synthroid 25 mg daily    Mixed hyperlipidemia Assessment & Plan: Continue to work on eating a healthy diet and exercise.  Labs drawn today.    Orders: -     Lipid panel  Gastroesophageal reflux disease without esophagitis Assessment & Plan: The current medical regimen is effective;  continue present plan and medications.  Continue omeprazole 40 mg daily.    Acute cystitis without hematuria Assessment & Plan: Start Cipro.  Orders: -     POCT URINALYSIS DIP (CLINITEK) -     Urine Culture -  Ciprofloxacin HCl; Take 1 tablet (500 mg total) by mouth 2 (two) times daily for 7 days.  Dispense: 14 tablet; Refill: 0  Screening for colon cancer Assessment & Plan: Refer to GI.  Orders: -     Ambulatory referral to Gastroenterology  BMI 33.0-33.9,adult Assessment & Plan: Continue to work on healthy diet and exercise.     Left lower quadrant abdominal pain Assessment & Plan: History given by patient is concerning for possible diverticulitis.  This is improving, but because she has a bladder infection also, I will treat her with Cipro.    Orders: -     Ciprofloxacin HCl; Take 1 tablet (500 mg total) by mouth 2 (two) times daily for 7 days.  Dispense: 14 tablet; Refill: 0     Meds ordered this encounter  Medications   ciprofloxacin (CIPRO) 500 MG tablet    Sig: Take 1 tablet (500 mg  total) by mouth 2 (two) times daily for 7 days.    Dispense:  14 tablet    Refill:  0    Orders Placed This Encounter  Procedures   Urine Culture   CBC with Differential/Platelet   CMP14+EGFR   Lipid panel   Ambulatory referral to Gastroenterology   POCT URINALYSIS DIP (CLINITEK)     Follow-up: No follow-ups on file.   I,Katherina A Bramblett,acting as a scribe for Blane Ohara, MD.,have documented all relevant documentation on the behalf of Blane Ohara, MD,as directed by  Blane Ohara, MD while in the presence of Blane Ohara, MD.   An After Visit Summary was printed and given to the patient.  Blane Ohara, MD  Family Practice [phone]

## 2023-04-03 NOTE — Assessment & Plan Note (Signed)
The current medical regimen is effective;  continue present plan and medications. ON lisinopril 40 mg one twice daily. Continue taking chlorthalidone 25 mg 1/2 daily. If sbp is still above 150 take chlorthalidone 25 mg daily.

## 2023-04-03 NOTE — Assessment & Plan Note (Signed)
Continue to work on eating a healthy diet and exercise.  Labs drawn today.  

## 2023-04-04 ENCOUNTER — Ambulatory Visit: Payer: BC Managed Care – PPO | Admitting: Family Medicine

## 2023-04-04 VITALS — BP 144/76 | HR 68 | Temp 96.7°F | Resp 18 | Ht 63.0 in | Wt 188.0 lb

## 2023-04-04 DIAGNOSIS — I10 Essential (primary) hypertension: Secondary | ICD-10-CM | POA: Diagnosis not present

## 2023-04-04 DIAGNOSIS — E782 Mixed hyperlipidemia: Secondary | ICD-10-CM

## 2023-04-04 DIAGNOSIS — E039 Hypothyroidism, unspecified: Secondary | ICD-10-CM

## 2023-04-04 DIAGNOSIS — Z1211 Encounter for screening for malignant neoplasm of colon: Secondary | ICD-10-CM

## 2023-04-04 DIAGNOSIS — K219 Gastro-esophageal reflux disease without esophagitis: Secondary | ICD-10-CM

## 2023-04-04 DIAGNOSIS — Z6833 Body mass index (BMI) 33.0-33.9, adult: Secondary | ICD-10-CM

## 2023-04-04 DIAGNOSIS — N3000 Acute cystitis without hematuria: Secondary | ICD-10-CM | POA: Diagnosis not present

## 2023-04-04 DIAGNOSIS — R1032 Left lower quadrant pain: Secondary | ICD-10-CM

## 2023-04-04 LAB — CMP14+EGFR
ALT: 33 IU/L — ABNORMAL HIGH (ref 0–32)
AST: 39 IU/L (ref 0–40)
Albumin: 4.6 g/dL (ref 3.9–4.9)
Alkaline Phosphatase: 79 IU/L (ref 44–121)
BUN/Creatinine Ratio: 11 — ABNORMAL LOW (ref 12–28)
BUN: 10 mg/dL (ref 8–27)
Bilirubin Total: 0.6 mg/dL (ref 0.0–1.2)
CO2: 25 mmol/L (ref 20–29)
Calcium: 10.1 mg/dL (ref 8.7–10.3)
Chloride: 104 mmol/L (ref 96–106)
Creatinine, Ser: 0.93 mg/dL (ref 0.57–1.00)
Globulin, Total: 2.2 g/dL (ref 1.5–4.5)
Glucose: 89 mg/dL (ref 70–99)
Potassium: 4.6 mmol/L (ref 3.5–5.2)
Sodium: 141 mmol/L (ref 134–144)
Total Protein: 6.8 g/dL (ref 6.0–8.5)
eGFR: 70 mL/min/{1.73_m2} (ref >59)

## 2023-04-04 LAB — CBC WITH DIFFERENTIAL/PLATELET
Basophils Absolute: 0.1 10*3/uL (ref 0.0–0.2)
Basos: 1 %
EOS (ABSOLUTE): 0.1 10*3/uL (ref 0.0–0.4)
Eos: 2 %
Hematocrit: 43.5 % (ref 34.0–46.6)
Hemoglobin: 14.8 g/dL (ref 11.1–15.9)
Immature Grans (Abs): 0 10*3/uL (ref 0.0–0.1)
Immature Granulocytes: 0 %
Lymphocytes Absolute: 2.1 10*3/uL (ref 0.7–3.1)
Lymphs: 41 %
MCH: 30 pg (ref 26.6–33.0)
MCHC: 34 g/dL (ref 31.5–35.7)
MCV: 88 fL (ref 79–97)
Monocytes Absolute: 0.6 10*3/uL (ref 0.1–0.9)
Monocytes: 12 %
Neutrophils Absolute: 2.3 10*3/uL (ref 1.4–7.0)
Neutrophils: 44 %
Platelets: 253 10*3/uL (ref 150–450)
RBC: 4.94 x10E6/uL (ref 3.77–5.28)
RDW: 13.1 % (ref 11.7–15.4)
WBC: 5.2 10*3/uL (ref 3.4–10.8)

## 2023-04-04 LAB — POCT URINALYSIS DIP (CLINITEK)
Bilirubin, UA: NEGATIVE
Blood, UA: NEGATIVE
Glucose, UA: NEGATIVE mg/dL
Ketones, POC UA: NEGATIVE mg/dL
Nitrite, UA: NEGATIVE
POC PROTEIN,UA: NEGATIVE
Spec Grav, UA: >=1.03 — AB (ref 1.010–1.025)
Urobilinogen, UA: 0.2 E.U./dL
pH, UA: 5.5 (ref 5.0–8.0)

## 2023-04-04 LAB — LIPID PANEL
Chol/HDL Ratio: 3.6 ratio (ref 0.0–4.4)
Cholesterol, Total: 192 mg/dL (ref 100–199)
HDL: 53 mg/dL (ref >39)
LDL Chol Calc (NIH): 118 mg/dL — ABNORMAL HIGH (ref 0–99)
Triglycerides: 116 mg/dL (ref 0–149)
VLDL Cholesterol Cal: 21 mg/dL (ref 5–40)

## 2023-04-04 MED ORDER — CIPROFLOXACIN HCL 500 MG PO TABS
500.0000 mg | ORAL_TABLET | Freq: Two times a day (BID) | ORAL | 0 refills | Status: AC
Start: 2023-04-04 — End: 2023-04-11

## 2023-04-04 NOTE — Patient Instructions (Signed)
Referral to Dr. Jennye Boroughs for colonoscopy. Begin cipro 500 mg twice daily for 7 days. Lab draw today.

## 2023-04-06 ENCOUNTER — Encounter: Payer: Self-pay | Admitting: Family Medicine

## 2023-04-06 DIAGNOSIS — N3000 Acute cystitis without hematuria: Secondary | ICD-10-CM | POA: Insufficient documentation

## 2023-04-06 DIAGNOSIS — R109 Unspecified abdominal pain: Secondary | ICD-10-CM | POA: Insufficient documentation

## 2023-04-06 DIAGNOSIS — Z1211 Encounter for screening for malignant neoplasm of colon: Secondary | ICD-10-CM | POA: Insufficient documentation

## 2023-04-06 NOTE — Assessment & Plan Note (Addendum)
Previously well controlled Continue Synthroid 25 mg daily

## 2023-04-06 NOTE — Assessment & Plan Note (Signed)
Start Cipro

## 2023-04-06 NOTE — Assessment & Plan Note (Signed)
The current medical regimen is effective;  continue present plan and medications. Continue omeprazole 40 mg daily.  

## 2023-04-06 NOTE — Assessment & Plan Note (Signed)
Refer to GI 

## 2023-04-06 NOTE — Assessment & Plan Note (Signed)
History given by patient is concerning for possible diverticulitis.  This is improving, but because she has a bladder infection also, I will treat her with Cipro.

## 2023-04-06 NOTE — Assessment & Plan Note (Signed)
Continue to work on healthy diet and exercise.   

## 2023-04-24 ENCOUNTER — Other Ambulatory Visit: Payer: Self-pay | Admitting: Family Medicine

## 2023-04-26 ENCOUNTER — Telehealth (INDEPENDENT_AMBULATORY_CARE_PROVIDER_SITE_OTHER): Payer: BC Managed Care – PPO | Admitting: Family Medicine

## 2023-04-26 ENCOUNTER — Telehealth: Payer: Self-pay

## 2023-04-26 VITALS — BP 139/67 | HR 72 | Ht 63.0 in | Wt 188.0 lb

## 2023-04-26 DIAGNOSIS — U071 COVID-19: Secondary | ICD-10-CM | POA: Diagnosis not present

## 2023-04-26 DIAGNOSIS — J069 Acute upper respiratory infection, unspecified: Secondary | ICD-10-CM | POA: Diagnosis not present

## 2023-04-26 MED ORDER — NIRMATRELVIR/RITONAVIR (PAXLOVID)TABLET: 3.0000 | ORAL_TABLET | Freq: Two times a day (BID) | ORAL | 0 refills | Status: AC

## 2023-04-26 NOTE — Telephone Encounter (Signed)
Patient left voicemail stating she was positive for covid. Returned call, states symptoms started Thursday around 3 a.m., tested at home and was positive. C/o cough and body aches. Stated when she coughs her chest feels tight and reports anytime she gets sick it tends to turn into pneumonia. Patient informed we did not have any available appointments, advised to schedule appt via mychart for a telehealth appt or go to UC. Patient verbalized understanding.

## 2023-04-26 NOTE — Progress Notes (Unsigned)
Virtual Visit via Video Note   This visit type was conducted either per patient request OR due to national recommendations for restrictions regarding the COVID-19 Pandemic (e.g. social distancing) in an effort to limit this patient's exposure and mitigate transmission in our community.  Due to her co-morbid illnesses, this patient is at least at moderate risk for complications without adequate follow up.  This format is felt to be most appropriate for this patient at this time.  All issues noted in this document were discussed and addressed.  A limited physical exam was performed with this format.  A verbal consent was obtained for the virtual visit.   Date:  05/01/2023   ID:  Caroline Kelly, DOB 07-20-1961, MRN [id number]  Patient Location: Home Provider Location: Office/Clinic  PCP:  Blane Ohara, MD   Chief Complaint:  covid positive  History of Present Illness:    Caroline Kelly is a 62 y.o. female with headache, body aches, sore throat, nasal congestion and sneezing, cough since one day ago. She took a rapid covid test yesterday and it was positive. She has been taking ibuprofen  She denies SOB, chest pain.  The patient does have symptoms concerning for COVID-19 infection (fever, chills, cough, or new shortness of breath).   Past Medical History:  Diagnosis Date   Allergic rhinitis    Allergy    GERD (gastroesophageal reflux disease)    Hyperlipidemia    Hypertension     Past Surgical History:  Procedure Laterality Date   CESAREAN SECTION     ENDOMETRIAL ABLATION  1989    Family History  Problem Relation Age of Onset   Heart failure Mother    Dementia Mother    Hyperlipidemia Father    Hypertension Father    CAD Father    Hypertension Sister    Hypertension Brother    Breast cancer Neg Hx     Social History   Socioeconomic History   Marital status: Married    Spouse name: Not on file   Number of children: Not on file   Years of education: Not on file    Highest education level: Not on file  Occupational History   Not on file  Tobacco Use   Smoking status: Former    Current packs/day: 0.00    Types: Cigarettes    Quit date: 09/2017    Years since quitting: 5.6   Smokeless tobacco: Never  Substance and Sexual Activity   Alcohol use: Yes    Comment: twice monthly   Drug use: Never   Sexual activity: Not on file  Other Topics Concern   Not on file  Social History Narrative   Not on file   Social Determinants of Health   Financial Resource Strain: Low Risk  (11/09/2021)   Overall Financial Resource Strain (CARDIA)    Difficulty of Paying Living Expenses: Not hard at all  Food Insecurity: No Food Insecurity (11/09/2021)   Hunger Vital Sign    Worried About Running Out of Food in the Last Year: Never true    Ran Out of Food in the Last Year: Never true  Transportation Needs: No Transportation Needs (11/09/2021)   PRAPARE - Administrator, Civil Service (Medical): No    Lack of Transportation (Non-Medical): No  Physical Activity: Sufficiently Active (11/09/2021)   Exercise Vital Sign    Days of Exercise per Week: 3 days    Minutes of Exercise per Session: 60 min  Stress: Stress  Concern Present (11/09/2021)   [employer] of Occupational Health - Occupational Stress Questionnaire    Feeling of Stress : Very much  Social Connections: Moderately Integrated (11/09/2021)   Social Connection and Isolation Panel [NHANES]    Frequency of Communication with Friends and Family: More than three times a week    Frequency of Social Gatherings with Friends and Family: More than three times a week    Attends Religious Services: More than 4 times per year    Active Member of [employer] or Organizations: Yes    Attends Banker Meetings: More than 4 times per year    Marital Status: Separated  Intimate Partner Violence: Not At Risk (11/09/2021)   Humiliation, Afraid, Rape, and Kick questionnaire    Fear of Current or Ex-Partner: No     Emotionally Abused: No    Physically Abused: No    Sexually Abused: No    Outpatient Medications Prior to Visit  Medication Sig Dispense Refill   albuterol (VENTOLIN HFA) 108 (90 Base) MCG/ACT inhaler Inhale 2 puffs into the lungs every 6 (six) hours as needed for wheezing or shortness of breath. 8 g 0   ALLERGY RELIEF 10 MG tablet TAKE 1 TABLET BY MOUTH ONCE DAILY 90 tablet 1   atorvastatin (LIPITOR) 40 MG tablet Take 1 tablet (40 mg total) by mouth daily. 90 tablet 1   chlorthalidone (HYGROTON) 25 MG tablet Take 1 tablet (25 mg total) by mouth daily. (Patient taking differently: Take 12.5 mg by mouth daily as needed.) 90 tablet 1   Coenzyme Q10 (CO Q 10 PO) Take 1 tablet by mouth daily at 6 (six) AM.     fluticasone (FLONASE) 50 MCG/ACT nasal spray Place 2 sprays into both nostrils daily. 16 g 6   levothyroxine (SYNTHROID) 25 MCG tablet TAKE ONE TABLET BY MOUTH EVERY DAY 90 tablet 0   lisinopril (ZESTRIL) 40 MG tablet TAKE ONE TABLET BY MOUTH TWICE DAILY 180 tablet 0   LORazepam (ATIVAN) 0.5 MG tablet TAKE 1 TABLET BY MOUTH TWICE DAILY AS NEEDED FOR ANXIETY. 20 tablet 1   meclizine (ANTIVERT) 25 MG tablet Take 1 tablet (25 mg total) by mouth 3 (three) times daily as needed for dizziness. 40 tablet 0   omeprazole (PRILOSEC) 40 MG capsule Take 1 capsule (40 mg total) by mouth daily. 30 capsule 3   ondansetron (ZOFRAN) 4 MG tablet Take 1 tablet (4 mg total) by mouth every 8 (eight) hours as needed for nausea or vomiting. 20 tablet 0   Probiotic Product (PROBIOTIC-10 PO) Take 1 tablet by mouth daily.     No facility-administered medications prior to visit.    Allergies  Allergen Reactions   Levofloxacin Hives   Latex Rash     Social History   Tobacco Use   Smoking status: Former    Current packs/day: 0.00    Types: Cigarettes    Quit date: 09/2017    Years since quitting: 5.6   Smokeless tobacco: Never  Substance Use Topics   Alcohol use: Yes    Comment: twice monthly    Drug use: Never     Review of Systems  Constitutional:  Positive for malaise/fatigue.  HENT:  Positive for congestion and sore throat.   Respiratory:  Positive for cough. Negative for shortness of breath.   Cardiovascular:  Negative for chest pain.  Neurological:  Positive for headaches.     Labs/Other Tests and Data Reviewed:    Recent Labs: 12/19/2022: TSH 3.770 04/04/2023:  ALT 33; BUN 10; Creatinine, Ser 0.93; Hemoglobin 14.8; Platelets 253; Potassium 4.6; Sodium 141   Recent Lipid Panel Lab Results  Component Value Date/Time   CHOL 192 04/04/2023 08:25 AM   TRIG 116 04/04/2023 08:25 AM   HDL 53 04/04/2023 08:25 AM   CHOLHDL 3.6 04/04/2023 08:25 AM   LDLCALC 118 (H) 04/04/2023 08:25 AM    Wt Readings from Last 3 Encounters:  04/26/23 188 lb (85.3 kg)  04/04/23 188 lb (85.3 kg)  01/07/23 187 lb (84.8 kg)     Objective:    Vital Signs:  BP 139/67   Pulse 72   Ht 5\' 3"  (1.6 m)   Wt 188 lb (85.3 kg)   BMI 33.30 kg/m    Physical Exam Neurological:     Mental Status: She is alert.      ASSESSMENT & PLAN:   Upper respiratory tract infection due to COVID-19 virus Assessment & Plan: Sent Paxlovid. Recommend rest and fluids. Recommend cough medicine if needed.    Other orders -     nirmatrelvir/ritonavir; Take 3 tablets by mouth 2 (two) times daily for 5 days. (Take nirmatrelvir 150 mg two tablets twice daily for 5 days and ritonavir 100 mg one tablet twice daily for 5 days) Patient GFR is 80  Dispense: 30 tablet; Refill: 0     No orders of the defined types were placed in this encounter.    Meds ordered this encounter  Medications   nirmatrelvir/ritonavir (PAXLOVID) 20 x 150 MG & 10 x 100MG  TABS    Sig: Take 3 tablets by mouth 2 (two) times daily for 5 days. (Take nirmatrelvir 150 mg two tablets twice daily for 5 days and ritonavir 100 mg one tablet twice daily for 5 days) Patient GFR is 80    Dispense:  30 tablet    Refill:  0    I spent 9 minutes  dedicated to the care of this patient on the date of this encounter to include face-to-face time with the patient.  Follow Up:  In Person prn  Signed, Blane Ohara, MD  05/01/2023 9:58 PM    [facility] [place]     I,Marla I Leal-Borjas,acting as a scribe for Blane Ohara, MD.,have documented all relevant documentation on the behalf of Blane Ohara, MD,as directed by  Blane Ohara, MD while in the presence of Blane Ohara, MD.

## 2023-04-27 DIAGNOSIS — J069 Acute upper respiratory infection, unspecified: Secondary | ICD-10-CM | POA: Insufficient documentation

## 2023-04-27 HISTORY — DX: Acute upper respiratory infection, unspecified: J06.9

## 2023-04-27 NOTE — Assessment & Plan Note (Signed)
Sent Paxlovid

## 2023-04-29 NOTE — Telephone Encounter (Signed)
Did visit. Dr. Sedalia Muta

## 2023-05-01 ENCOUNTER — Encounter: Payer: Self-pay | Admitting: Family Medicine

## 2023-06-04 ENCOUNTER — Other Ambulatory Visit: Payer: Self-pay

## 2023-06-04 DIAGNOSIS — J309 Allergic rhinitis, unspecified: Secondary | ICD-10-CM

## 2023-06-04 DIAGNOSIS — J0100 Acute maxillary sinusitis, unspecified: Secondary | ICD-10-CM

## 2023-06-04 MED ORDER — LORATADINE 10 MG PO TABS: 10.0000 mg | ORAL_TABLET | Freq: Every day | ORAL | 1 refills | Status: DC

## 2023-06-10 ENCOUNTER — Other Ambulatory Visit: Payer: Self-pay | Admitting: Family Medicine

## 2023-07-18 ENCOUNTER — Encounter: Payer: Self-pay | Admitting: Family Medicine

## 2023-07-18 ENCOUNTER — Ambulatory Visit: Payer: BC Managed Care – PPO | Admitting: Family Medicine

## 2023-07-18 VITALS — BP 128/74 | HR 60 | Temp 97.6°F | Ht 63.0 in | Wt 189.0 lb

## 2023-07-18 DIAGNOSIS — Z6833 Body mass index (BMI) 33.0-33.9, adult: Secondary | ICD-10-CM

## 2023-07-18 DIAGNOSIS — I10 Essential (primary) hypertension: Secondary | ICD-10-CM | POA: Diagnosis not present

## 2023-07-18 DIAGNOSIS — Z1211 Encounter for screening for malignant neoplasm of colon: Secondary | ICD-10-CM

## 2023-07-18 DIAGNOSIS — E782 Mixed hyperlipidemia: Secondary | ICD-10-CM | POA: Diagnosis not present

## 2023-07-18 DIAGNOSIS — K219 Gastro-esophageal reflux disease without esophagitis: Secondary | ICD-10-CM

## 2023-07-18 DIAGNOSIS — Z1231 Encounter for screening mammogram for malignant neoplasm of breast: Secondary | ICD-10-CM

## 2023-07-18 DIAGNOSIS — E039 Hypothyroidism, unspecified: Secondary | ICD-10-CM

## 2023-07-18 DIAGNOSIS — F419 Anxiety disorder, unspecified: Secondary | ICD-10-CM

## 2023-07-18 MED ORDER — CHLORTHALIDONE 25 MG PO TABS: 25.0000 mg | ORAL_TABLET | Freq: Every day | ORAL | 1 refills | Status: DC

## 2023-07-18 NOTE — Assessment & Plan Note (Signed)
Continue to work on eating a healthy diet and exercise.  Labs drawn today.  

## 2023-07-18 NOTE — Assessment & Plan Note (Signed)
The current medical regimen is effective;  continue present plan and medications. lorazepam 0.5 mg one twice a day as needed for severe anxiety

## 2023-07-18 NOTE — Progress Notes (Signed)
Subjective:  Patient ID: [name], female    DOB: [birth year]  Age: 62 y.o. MRN: [id number]  Chief Complaint  Patient presents with   Medical Management of Chronic Issues    HPI Hypertension: On lisinopril 40 mg twice a day, chlorthalidone 25 mg 1/2 pill daily as needed if bp is up.  BP last Thursday 174/101, HR 98. Then 146/105 and HR 124 after sat for a while. Took a whole chlorthalidone and it improved after 2-3 hours.  Friday 150/69.   Hypothyroidism: Taking Levothyroxine 25 mg daily.   Hyperlipidemia: Lipitor 40 mg daily. Had to stop in the last few weeks due to muscle pain.    GERD: Omeprazole 40 mg daily.     07/18/2023    7:55 AM 04/04/2023    8:02 AM 12/28/2021   10:52 AM 10/11/2021    7:48 AM 11/17/2020    9:25 AM  Depression screen PHQ 2/9  Decreased Interest 0 0 0 1 0  Down, Depressed, Hopeless 0 0 0 1 0  PHQ - 2 Score 0 0 0 2 0  Altered sleeping  1  3   Tired, decreased energy  0  0   Change in appetite  0  1   Feeling bad or failure about yourself   0  0   Trouble concentrating  0  1   Moving slowly or fidgety/restless  0  0   Suicidal thoughts  0  0   PHQ-9 Score  1  7   Difficult doing work/chores  Not difficult at all           07/18/2023    7:54 AM  Fall Risk   Falls in the past year? 0  Number falls in past yr: 0  Injury with Fall? 0  Risk for fall due to : No Fall Risks  Follow up Falls evaluation completed    Patient Care Team: Blane Ohara, MD as PCP - General (Family Medicine) Thomasene Ripple, DO as PCP - Cardiology (Cardiology)   Review of Systems  Constitutional:  Negative for chills, fatigue and fever.  HENT:  Negative for congestion, ear pain, rhinorrhea and sore throat.   Respiratory:  Negative for cough and shortness of breath.   Cardiovascular:  Negative for chest pain.  Gastrointestinal:  Negative for abdominal pain, constipation, diarrhea, nausea and vomiting.  Genitourinary:  Negative for dysuria and urgency.   Musculoskeletal:  Negative for back pain and myalgias.  Neurological:  Negative for dizziness, weakness, light-headedness and headaches.  Psychiatric/Behavioral:  Negative for dysphoric mood. The patient is not nervous/anxious.     Current Outpatient Medications on File Prior to Visit  Medication Sig Dispense Refill   albuterol (VENTOLIN HFA) 108 (90 Base) MCG/ACT inhaler Inhale 2 puffs into the lungs every 6 (six) hours as needed for wheezing or shortness of breath. 8 g 0   atorvastatin (LIPITOR) 40 MG tablet Take 1 tablet (40 mg total) by mouth daily. 90 tablet 1   Coenzyme Q10 (CO Q 10 PO) Take 1 tablet by mouth daily at 6 (six) AM.     fluticasone (FLONASE) 50 MCG/ACT nasal spray Place 2 sprays into both nostrils daily. 16 g 6   levothyroxine (SYNTHROID) 25 MCG tablet TAKE ONE TABLET BY MOUTH EVERY DAY 90 tablet 0   lisinopril (ZESTRIL) 40 MG tablet TAKE ONE TABLET BY MOUTH TWICE DAILY 180 tablet 0   loratadine (ALLERGY RELIEF) 10 MG tablet Take 1 tablet (10 mg total) by mouth  daily. 90 tablet 1   LORazepam (ATIVAN) 0.5 MG tablet TAKE 1 TABLET BY MOUTH TWICE DAILY AS NEEDED FOR ANXIETY. 20 tablet 1   meclizine (ANTIVERT) 25 MG tablet Take 1 tablet (25 mg total) by mouth 3 (three) times daily as needed for dizziness. 40 tablet 0   omeprazole (PRILOSEC) 40 MG capsule Take 1 capsule (40 mg total) by mouth daily. 30 capsule 3   ondansetron (ZOFRAN) 4 MG tablet Take 1 tablet (4 mg total) by mouth every 8 (eight) hours as needed for nausea or vomiting. 20 tablet 0   Probiotic Product (PROBIOTIC-10 PO) Take 1 tablet by mouth daily.     No current facility-administered medications on file prior to visit.   Past Medical History:  Diagnosis Date   Allergic rhinitis    Allergy    GERD (gastroesophageal reflux disease)    Hyperlipidemia    Hypertension    Upper respiratory tract infection due to COVID-19 virus 04/27/2023   Past Surgical History:  Procedure Laterality Date   CESAREAN  SECTION     ENDOMETRIAL ABLATION  1989    Family History  Problem Relation Age of Onset   Heart failure Mother    Dementia Mother    Hyperlipidemia Father    Hypertension Father    CAD Father    Hypertension Sister    Hypertension Brother    Breast cancer Neg Hx    Social History   Socioeconomic History   Marital status: Married    Spouse name: Not on file   Number of children: Not on file   Years of education: Not on file   Highest education level: Not on file  Occupational History   Not on file  Tobacco Use   Smoking status: Former    Current packs/day: 0.00    Types: Cigarettes    Quit date: 09/2017    Years since quitting: 5.8   Smokeless tobacco: Never  Substance and Sexual Activity   Alcohol use: Yes    Comment: twice monthly   Drug use: Never   Sexual activity: Not on file  Other Topics Concern   Not on file  Social History Narrative   Not on file   Social Determinants of Health   Financial Resource Strain: Low Risk  (07/18/2023)   Overall Financial Resource Strain (CARDIA)    Difficulty of Paying Living Expenses: Not hard at all  Food Insecurity: No Food Insecurity (07/18/2023)   Hunger Vital Sign    Worried About Running Out of Food in the Last Year: Never true    Ran Out of Food in the Last Year: Never true  Transportation Needs: No Transportation Needs (07/18/2023)   PRAPARE - Administrator, Civil Service (Medical): No    Lack of Transportation (Non-Medical): No  Physical Activity: Sufficiently Active (07/18/2023)   Exercise Vital Sign    Days of Exercise per Week: 3 days    Minutes of Exercise per Session: 60 min  Stress: No Stress Concern Present (07/18/2023)   [employer] of Occupational Health - Occupational Stress Questionnaire    Feeling of Stress : Not at all  Social Connections: Moderately Integrated (11/09/2021)   Social Connection and Isolation Panel [NHANES]    Frequency of Communication with Friends and Family: More  than three times a week    Frequency of Social Gatherings with Friends and Family: More than three times a week    Attends Religious Services: More than 4 times per year  Active Member of Clubs or Organizations: Yes    Attends Banker Meetings: More than 4 times per year    Marital Status: Separated    Objective:  BP 128/74   Pulse 60   Temp 97.6 F (36.4 C)   Ht 5\' 3"  (1.6 m)   Wt 189 lb (85.7 kg)   SpO2 97%   BMI 33.48 kg/m      07/18/2023    7:54 AM 04/26/2023   12:42 PM 04/04/2023    7:58 AM  BP/Weight  Systolic BP 128 139 144  Diastolic BP 74 67 76  Wt. (Lbs) 189 188 188  BMI 33.48 kg/m2 33.3 kg/m2 33.3 kg/m2    Physical Exam Vitals reviewed.  Constitutional:      Appearance: Normal appearance. She is normal weight.  Neck:     Vascular: No carotid bruit.  Cardiovascular:     Rate and Rhythm: Normal rate and regular rhythm.     Heart sounds: Normal heart sounds.  Pulmonary:     Effort: Pulmonary effort is normal. No respiratory distress.     Breath sounds: Normal breath sounds.  Abdominal:     General: Abdomen is flat. Bowel sounds are normal.     Palpations: Abdomen is soft.     Tenderness: There is no abdominal tenderness.  Neurological:     Mental Status: She is alert and oriented to person, place, and time.  Psychiatric:        Mood and Affect: Mood normal.        Behavior: Behavior normal.     Diabetic Foot Exam - Simple   No data filed      Lab Results  Component Value Date   WBC 5.2 04/04/2023   HGB 14.8 04/04/2023   HCT 43.5 04/04/2023   PLT 253 04/04/2023   GLUCOSE 89 04/04/2023   CHOL 192 04/04/2023   TRIG 116 04/04/2023   HDL 53 04/04/2023   LDLCALC 118 (H) 04/04/2023   ALT 33 (H) 04/04/2023   AST 39 04/04/2023   NA 141 04/04/2023   K 4.6 04/04/2023   CL 104 04/04/2023   CREATININE 0.93 04/04/2023   BUN 10 04/04/2023   CO2 25 04/04/2023   TSH 3.770 12/19/2022   HGBA1C 5.5 07/05/2021      Assessment & Plan:     Screening for colon cancer -     Ambulatory referral to Gastroenterology  Essential hypertension, benign Assessment & Plan: Not at goal.  Continue lisinopril 40 mg one twice daily.  Start taking chlorthalidone 25 mg 1 daily.  Check labs. Check bp daily.  If not improving, call for follow up appointment in 2-3 weeks.   Orders: -     CBC with Differential/Platelet -     Comprehensive metabolic panel -     Chlorthalidone; Take 1 tablet (25 mg total) by mouth daily.  Dispense: 90 tablet; Refill: 1  Gastroesophageal reflux disease without esophagitis Assessment & Plan: The current medical regimen is effective;  continue present plan and medications.  Continue omeprazole 40 mg daily.    Acquired hypothyroidism Assessment & Plan: Previously well controlled Continue Synthroid 25 mg daily    Mixed hyperlipidemia Assessment & Plan: Continue to work on eating a healthy diet and exercise.  Labs drawn today.    Orders: -     Lipid panel  Anxiety Assessment & Plan: The current medical regimen is effective;  continue present plan and medications. lorazepam 0.5 mg one twice a day  as needed for severe anxiety   Encounter for screening mammogram for malignant neoplasm of breast -     3D Screening Mammogram, Left and Right; Future  BMI 33.0-33.9,adult Assessment & Plan: Continue to work on healthy diet and exercise.        Meds ordered this encounter  Medications   chlorthalidone (HYGROTON) 25 MG tablet    Sig: Take 1 tablet (25 mg total) by mouth daily.    Dispense:  90 tablet    Refill:  1    Orders Placed This Encounter  Procedures   MM 3D SCREENING MAMMOGRAM BILATERAL BREAST   CBC with Differential/Platelet   Comprehensive metabolic panel   Lipid panel   Ambulatory referral to Gastroenterology     Follow-up: Return in about 3 months (around 10/18/2023) for chronic follow up.   I,Marla I Leal-Borjas,acting as a scribe for Blane Ohara, MD.,have documented  all relevant documentation on the behalf of Blane Ohara, MD,as directed by  Blane Ohara, MD while in the presence of Blane Ohara, MD.   An After Visit Summary was printed and given to the patient.  Blane Ohara, MD [facility] [phone]

## 2023-07-18 NOTE — Assessment & Plan Note (Signed)
Previously well controlled Continue Synthroid 25 mg daily

## 2023-07-18 NOTE — Patient Instructions (Addendum)
Continue lisinopril to 40 mg twice daily  Start on chlorthalidone 25 mg once daily.  Check bp daily.  Follow up in 3 months.  If bp is not doing well, call for follow up sooner.

## 2023-07-18 NOTE — Assessment & Plan Note (Signed)
The current medical regimen is effective;  continue present plan and medications. Continue omeprazole 40 mg daily.  

## 2023-07-18 NOTE — Assessment & Plan Note (Addendum)
Not at goal.  Continue lisinopril 40 mg one twice daily.  Start taking chlorthalidone 25 mg 1 daily.  Check labs. Check bp daily.  If not improving, call for follow up appointment in 2-3 weeks.

## 2023-07-18 NOTE — Assessment & Plan Note (Signed)
Continue to work on healthy diet and exercise.   

## 2023-07-19 LAB — COMPREHENSIVE METABOLIC PANEL
ALT: 40 [IU]/L — ABNORMAL HIGH (ref 0–32)
AST: 44 [IU]/L — ABNORMAL HIGH (ref 0–40)
Albumin: 4.3 g/dL (ref 3.9–4.9)
Alkaline Phosphatase: 71 [IU]/L (ref 44–121)
BUN/Creatinine Ratio: 21 (ref 12–28)
BUN: 18 mg/dL (ref 8–27)
Bilirubin Total: 0.3 mg/dL (ref 0.0–1.2)
CO2: 25 mmol/L (ref 20–29)
Calcium: 10.4 mg/dL — ABNORMAL HIGH (ref 8.7–10.3)
Chloride: 102 mmol/L (ref 96–106)
Creatinine, Ser: 0.87 mg/dL (ref 0.57–1.00)
Globulin, Total: 2.4 g/dL (ref 1.5–4.5)
Glucose: 97 mg/dL (ref 70–99)
Potassium: 4.6 mmol/L (ref 3.5–5.2)
Sodium: 142 mmol/L (ref 134–144)
Total Protein: 6.7 g/dL (ref 6.0–8.5)
eGFR: 76 mL/min/{1.73_m2} (ref >59)

## 2023-07-19 LAB — CBC WITH DIFFERENTIAL/PLATELET
Basophils Absolute: 0.1 10*3/uL (ref 0.0–0.2)
Basos: 1 %
EOS (ABSOLUTE): 0.1 10*3/uL (ref 0.0–0.4)
Eos: 2 %
Hematocrit: 45.1 % (ref 34.0–46.6)
Hemoglobin: 14.6 g/dL (ref 11.1–15.9)
Immature Grans (Abs): 0 10*3/uL (ref 0.0–0.1)
Immature Granulocytes: 0 %
Lymphocytes Absolute: 1.7 10*3/uL (ref 0.7–3.1)
Lymphs: 39 %
MCH: 29.9 pg (ref 26.6–33.0)
MCHC: 32.4 g/dL (ref 31.5–35.7)
MCV: 92 fL (ref 79–97)
Monocytes Absolute: 0.5 10*3/uL (ref 0.1–0.9)
Monocytes: 12 %
Neutrophils Absolute: 2 10*3/uL (ref 1.4–7.0)
Neutrophils: 46 %
Platelets: 270 10*3/uL (ref 150–450)
RBC: 4.89 x10E6/uL (ref 3.77–5.28)
RDW: 13.3 % (ref 11.7–15.4)
WBC: 4.4 10*3/uL (ref 3.4–10.8)

## 2023-07-19 LAB — LIPID PANEL
Chol/HDL Ratio: 6.7 ratio — ABNORMAL HIGH (ref 0.0–4.4)
Cholesterol, Total: 293 mg/dL — ABNORMAL HIGH (ref 100–199)
HDL: 44 mg/dL (ref >39)
LDL Chol Calc (NIH): 220 mg/dL — ABNORMAL HIGH (ref 0–99)
Triglycerides: 155 mg/dL — ABNORMAL HIGH (ref 0–149)
VLDL Cholesterol Cal: 29 mg/dL (ref 5–40)

## 2023-08-16 ENCOUNTER — Encounter: Payer: Self-pay | Admitting: Family Medicine

## 2023-08-16 ENCOUNTER — Ambulatory Visit (INDEPENDENT_AMBULATORY_CARE_PROVIDER_SITE_OTHER): Payer: BC Managed Care – PPO | Admitting: Family Medicine

## 2023-08-16 DIAGNOSIS — J069 Acute upper respiratory infection, unspecified: Secondary | ICD-10-CM | POA: Diagnosis not present

## 2023-08-16 DIAGNOSIS — I209 Angina pectoris, unspecified: Secondary | ICD-10-CM | POA: Insufficient documentation

## 2023-08-16 MED ORDER — AMOXICILLIN-POT CLAVULANATE 875-125 MG PO TABS
1.0000 | ORAL_TABLET | Freq: Two times a day (BID) | ORAL | 0 refills | Status: DC
Start: 2023-08-16 — End: 2023-10-30

## 2023-08-16 MED ORDER — GUAIFENESIN-DM 100-10 MG/5ML PO SYRP
5.0000 mL | ORAL_SOLUTION | ORAL | 0 refills | Status: DC | PRN
Start: 2023-08-16 — End: 2023-10-30

## 2023-08-16 NOTE — Assessment & Plan Note (Signed)
Symptoms for a week with green sputum, cough, and shortness of breath. History of recurrent pneumonia. No recent COVID-19 testing but had COVID-19 in September. -Start Augmentin twice daily for 10 days. -Use Albuterol inhaler every six hours as needed for shortness of breath, rinse your mouth with water afterwards -Continue Mucinex for cough and congestion. -If no improvement, consider steroid shot on Monday -Increase fluid intake, rest and practice good hand hygeine -Try lemon and honey and/or cough drops for cough

## 2023-08-16 NOTE — Progress Notes (Signed)
Virtual Visit via Telephone Note   Due to her co-morbid illnesses, this patient is at least at moderate risk for complications without adequate follow up.  This format is felt to be most appropriate for this patient at this time.  The patient did not have access to video technology/had technical difficulties with video requiring transitioning to audio format only (telephone).  All issues noted in this document were discussed and addressed.  No physical exam could be performed with this format.  Patient verbally consented to a telehealth visit.   Date:  08/16/2023   ID:  [name], DOB [birth year], MRN [id number]  Patient Location: Home Provider Location: Office/Clinic  PCP:  Blane Ohara, MD   Chief Complaint:  runny nose, cough, chest congestion, shortness of breath, & fever  History of Present Illness:    Caroline Kelly is a 62 y.o. female presents with a week-long history of a very runny nose that has recently turned green. They also report a cough that has moved to their chest, causing pain and producing green sputum. They have a history of multiple episodes of pneumonia and are concerned about a recurrence. They have been experiencing intermittent fevers and shortness of breath. They have not been using their prescribed albuterol inhaler for the shortness of breath. They have tried Mucinex and Benadryl for symptom relief, with the latter somewhat drying their nose. They have a history of COVID-19 infection in September and have had it six times in total. They deny current symptoms are similar to their previous COVID-19 infections. They also mention that their grandsons have been sick recently.  The patient does have symptoms concerning for COVID-19 infection (fever, chills, cough, or new shortness of breath).     Past Medical History:  Diagnosis Date   Allergic rhinitis    Allergy    GERD (gastroesophageal reflux disease)    Hyperlipidemia    Hypertension    Upper  respiratory tract infection due to COVID-19 virus 04/27/2023   Past Surgical History:  Procedure Laterality Date   CESAREAN SECTION     ENDOMETRIAL ABLATION  1989    @[facility] @  Current Meds  Medication Sig   albuterol (VENTOLIN HFA) 108 (90 Base) MCG/ACT inhaler Inhale 2 puffs into the lungs every 6 (six) hours as needed for wheezing or shortness of breath.   amoxicillin-clavulanate (AUGMENTIN) 875-125 MG tablet Take 1 tablet by mouth 2 (two) times daily.   atorvastatin (LIPITOR) 40 MG tablet Take 1 tablet (40 mg total) by mouth daily.   chlorthalidone (HYGROTON) 25 MG tablet Take 1 tablet (25 mg total) by mouth daily.   Coenzyme Q10 (CO Q 10 PO) Take 1 tablet by mouth daily at 6 (six) AM.   guaiFENesin-dextromethorphan (ROBITUSSIN DM) 100-10 MG/5ML syrup Take 5 mLs by mouth every 4 (four) hours as needed for cough.   levothyroxine (SYNTHROID) 25 MCG tablet TAKE ONE TABLET BY MOUTH EVERY DAY   lisinopril (ZESTRIL) 40 MG tablet TAKE ONE TABLET BY MOUTH TWICE DAILY   loratadine (ALLERGY RELIEF) 10 MG tablet Take 1 tablet (10 mg total) by mouth daily.   LORazepam (ATIVAN) 0.5 MG tablet TAKE 1 TABLET BY MOUTH TWICE DAILY AS NEEDED FOR ANXIETY.   Probiotic Product (PROBIOTIC-10 PO) Take 1 tablet by mouth daily.     Allergies:   Levofloxacin and Latex   Social History   Tobacco Use   Smoking status: Former    Current packs/day: 0.00    Types: Cigarettes    Quit  date: 09/2017    Years since quitting: 5.9   Smokeless tobacco: Never  Substance Use Topics   Alcohol use: Yes    Comment: twice monthly   Drug use: Never     Family Hx: The patient's family history includes CAD in her father; Dementia in her mother; Heart failure in her mother; Hyperlipidemia in her father; Hypertension in her brother, father, and sister. There is no history of Breast cancer.  Review of Systems  Constitutional:  Positive for fever and malaise/fatigue. Negative for chills.  HENT:  Positive for congestion.  Negative for sinus pain and sore throat.        Runny nose    Respiratory:  Positive for cough (productive - green phlegm) and shortness of breath.   Cardiovascular:  Negative for chest pain.  Gastrointestinal:  Negative for diarrhea, nausea and vomiting.  Genitourinary: Negative.   Neurological:  Negative for headaches.     Labs/Other Tests and Data Reviewed:    Recent Labs: 12/19/2022: TSH 3.770 07/18/2023: ALT 40; BUN 18; Creatinine, Ser 0.87; Hemoglobin 14.6; Platelets 270; Potassium 4.6; Sodium 142   Recent Lipid Panel Lab Results  Component Value Date/Time   CHOL 293 (H) 07/18/2023 08:25 AM   TRIG 155 (H) 07/18/2023 08:25 AM   HDL 44 07/18/2023 08:25 AM   CHOLHDL 6.7 (H) 07/18/2023 08:25 AM   LDLCALC 220 (H) 07/18/2023 08:25 AM    Wt Readings from Last 3 Encounters:  07/18/23 189 lb (85.7 kg)  04/26/23 188 lb (85.3 kg)  04/04/23 188 lb (85.3 kg)     Objective:    Vital Signs:  There were no vitals taken for this visit. Assessment was unable to be performed due to the limitations of the tele visit.     ASSESSMENT & PLAN:    Upper respiratory tract infection Symptoms for a week with green sputum, cough, and shortness of breath. History of recurrent pneumonia. No recent COVID-19 testing but had COVID-19 in September. -Start Augmentin twice daily for 10 days. -Use Albuterol inhaler every six hours as needed for shortness of breath, rinse your mouth with water afterwards -Continue Mucinex for cough and congestion. -If no improvement, consider steroid shot on Monday -Increase fluid intake, rest and practice good hand hygeine -Try lemon and honey and/or cough drops for cough     Time:   Today, I have spent 12 minutes with the patient with telehealth technology discussing the above problems.    Tests Ordered: No orders of the defined types were placed in this encounter.   Medication Changes: Meds ordered this encounter  Medications    amoxicillin-clavulanate (AUGMENTIN) 875-125 MG tablet    Sig: Take 1 tablet by mouth 2 (two) times daily.    Dispense:  20 tablet    Refill:  0   guaiFENesin-dextromethorphan (ROBITUSSIN DM) 100-10 MG/5ML syrup    Sig: Take 5 mLs by mouth every 4 (four) hours as needed for cough.    Dispense:  118 mL    Refill:  0    Follow Up:  In Person prn  Total time spent on today's visit was 30 minutes, including both face-to-face time and nonface-to-face time personally spent on review of chart (labs and imaging), discussing labs and goals, discussing further work-up, treatment options, referrals to specialist if needed, reviewing outside records if pertinent, answering patient's questions, and coordinating care.    Signed, Lajuana Matte, FNP [facility] [phone]  08/16/2023 8:37 PM    [facility] [place]

## 2023-09-11 ENCOUNTER — Other Ambulatory Visit: Payer: Self-pay | Admitting: Family Medicine

## 2023-10-16 IMAGING — MG MM DIGITAL SCREENING BILAT W/ TOMO AND CAD
8 series · 8 of 24 positions shown · non-contrast
Comparison: Previous exam(s).

CLINICAL DATA: Screening.

EXAM:
DIGITAL SCREENING BILATERAL MAMMOGRAM WITH TOMOSYNTHESIS AND CAD
TECHNIQUE: Bilateral screening digital craniocaudal and mediolateral oblique
mammograms were obtained. Bilateral screening digital breast
tomosynthesis was performed. The images were evaluated with
computer-aided detection.

[R MLO synth-2D]
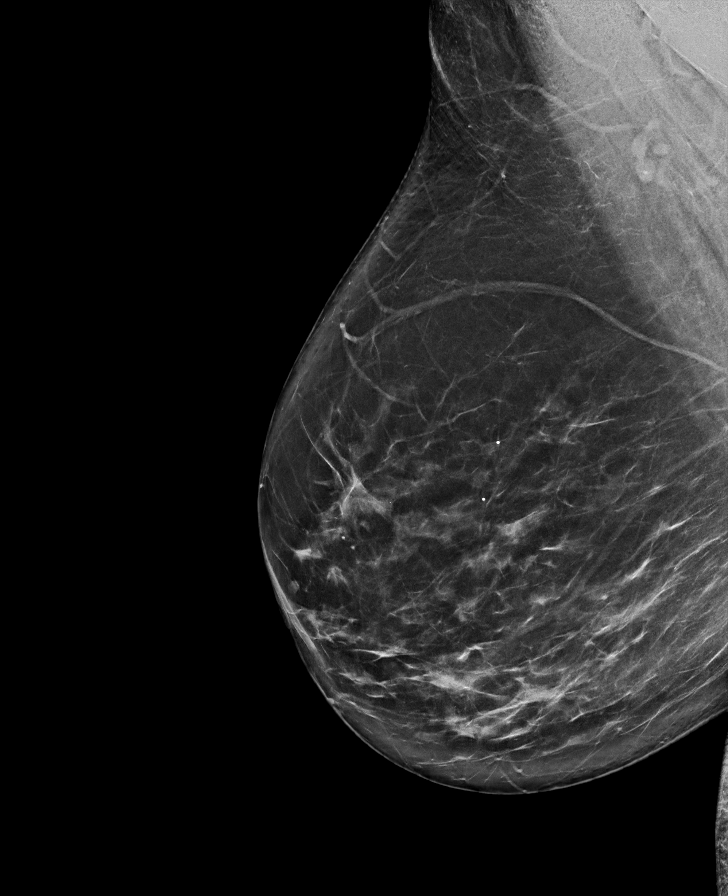

[L CC synth-2D]
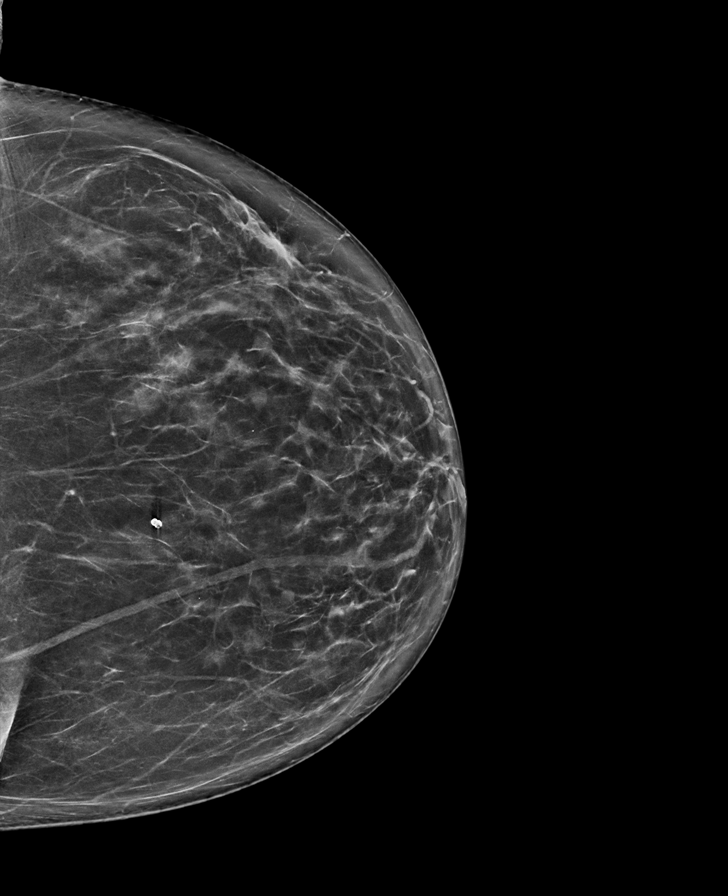

[L MLO synth-2D]
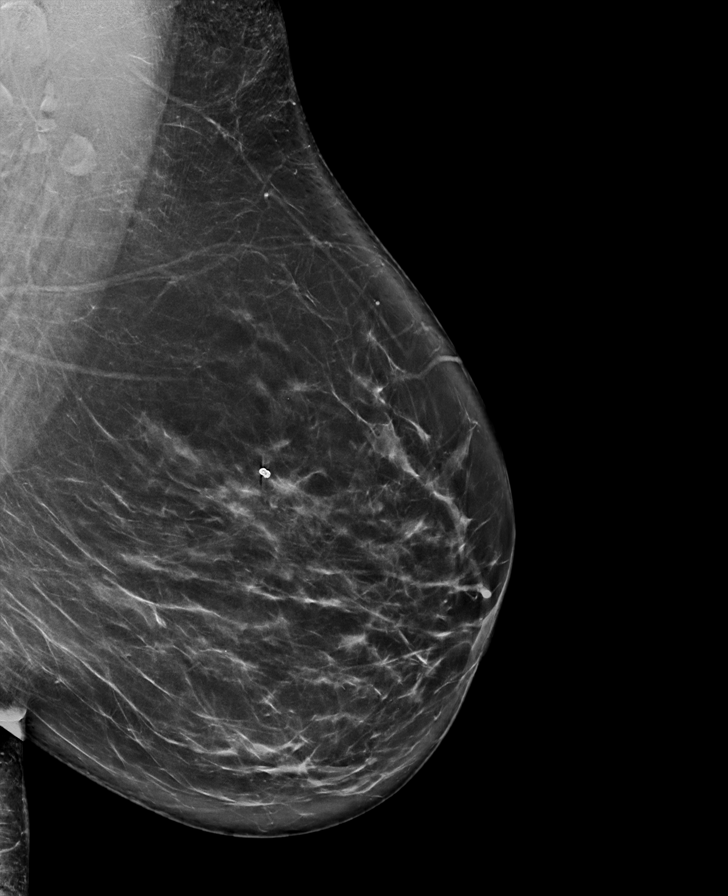

[R CC synth-2D]
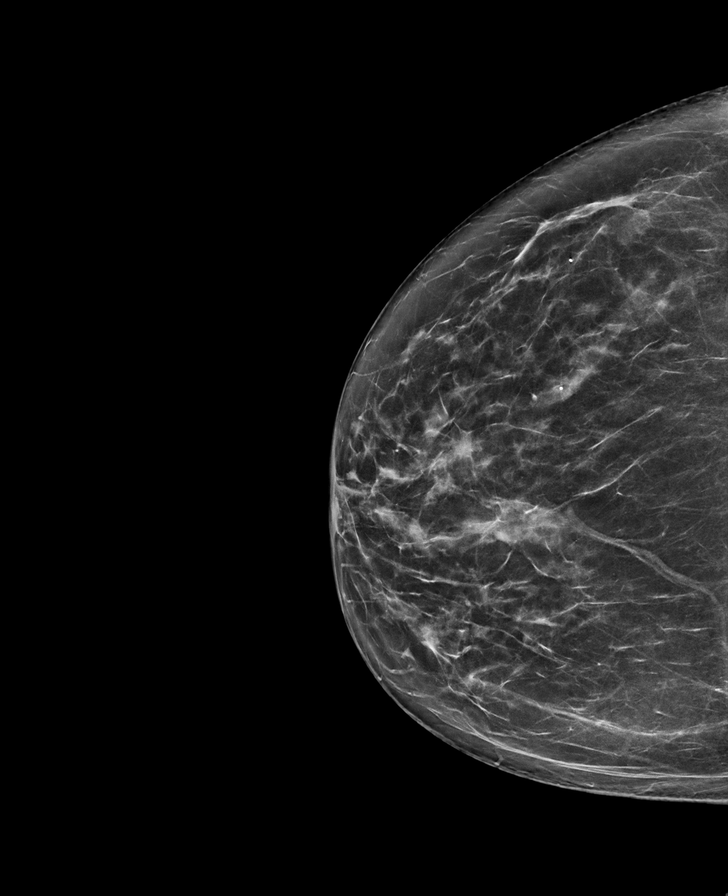

[L CC tomo · tomo slice 41/81.0]
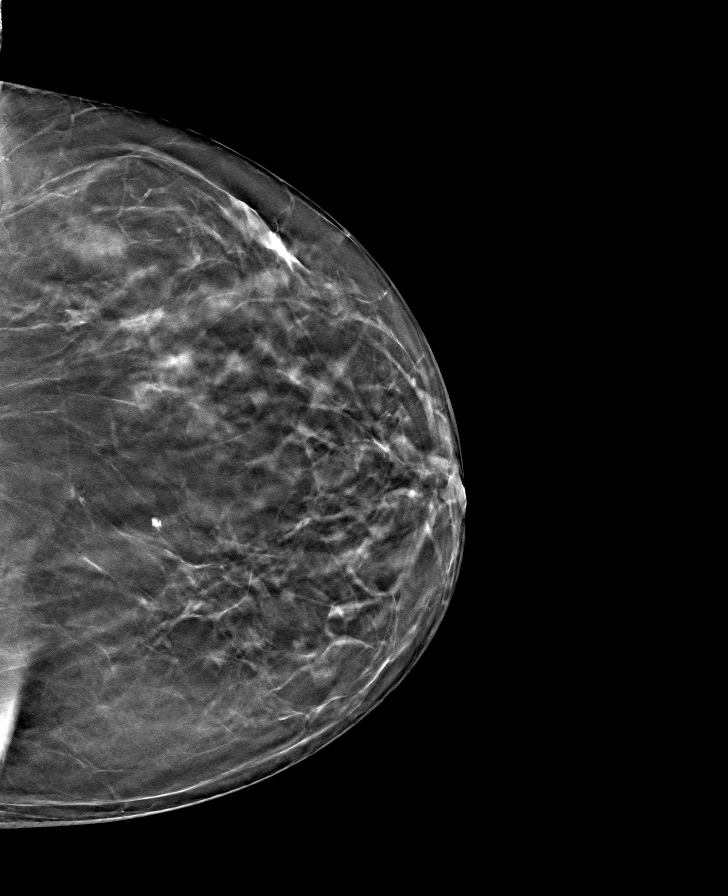

[R CC tomo · tomo slice 41/80.0]
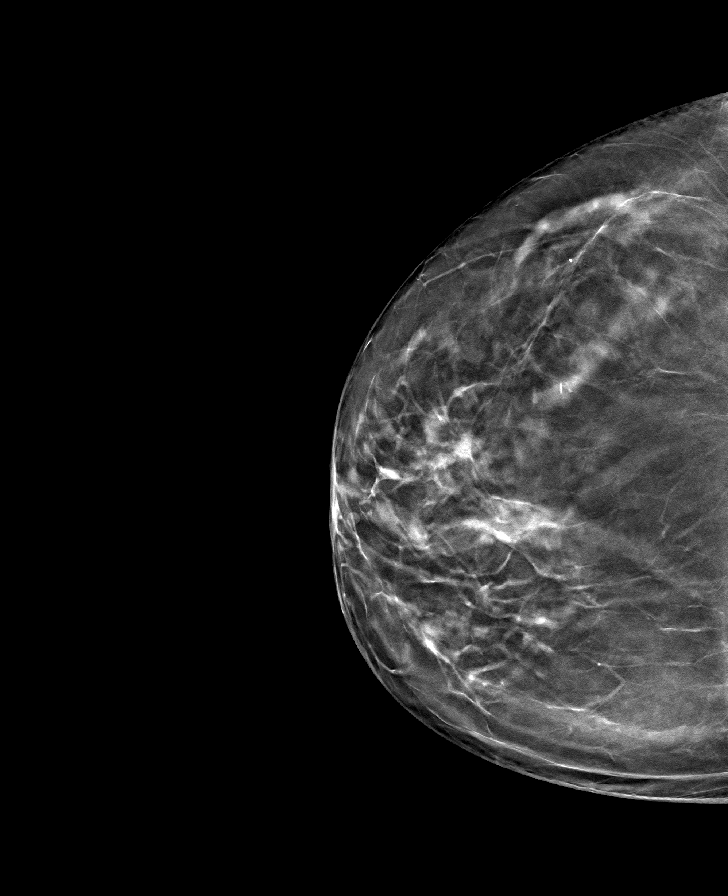

[R MLO tomo · tomo slice 45/89.0]
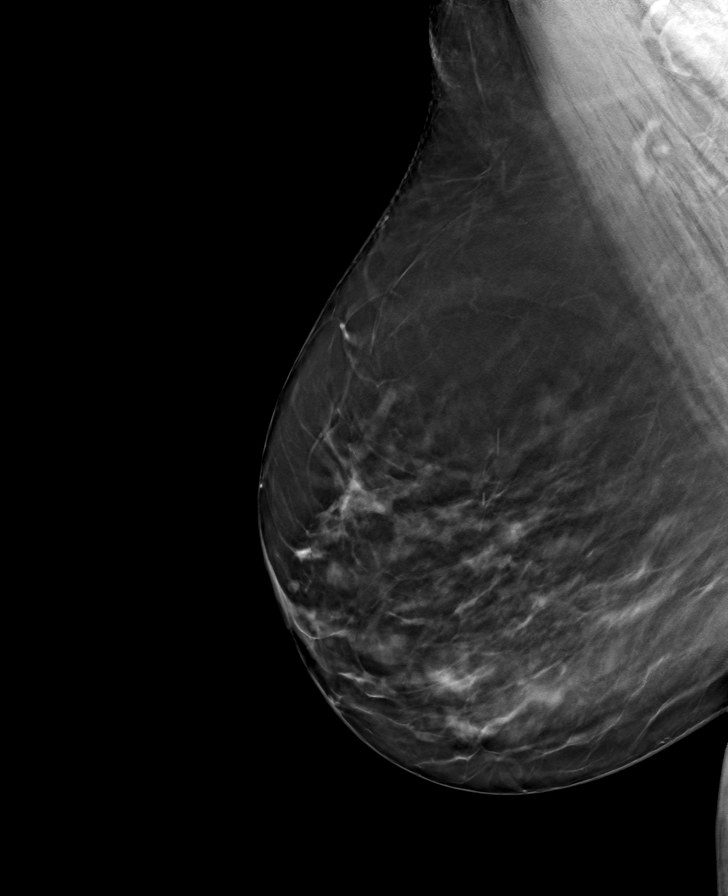

[L MLO tomo · tomo slice 47/92.0]
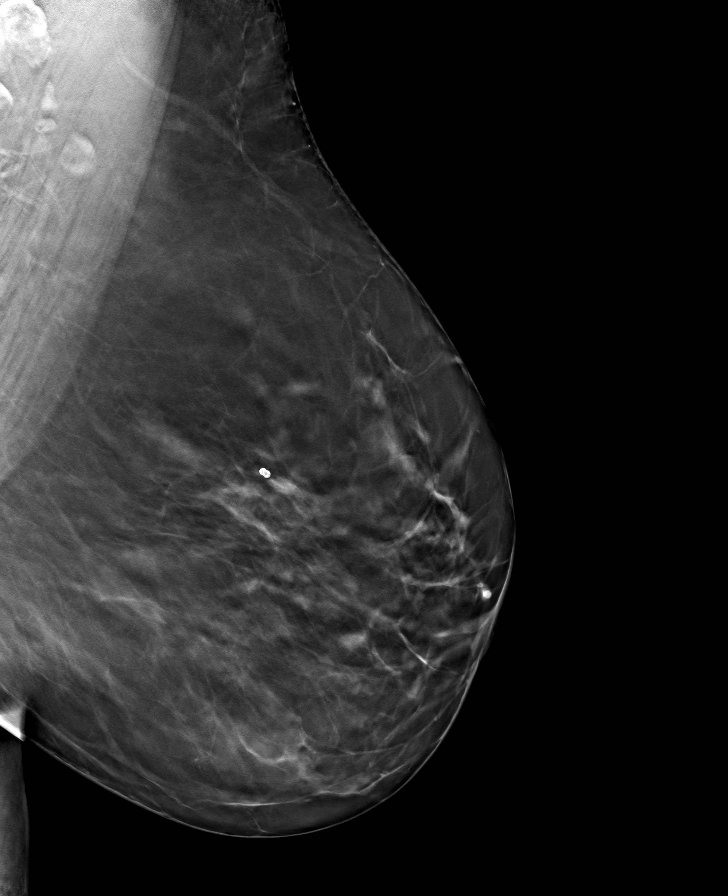

[8 of 24 positions shown; findings below may reference images not displayed]

ACR Breast Density Category b: There are scattered areas of
fibroglandular density.
FINDINGS: There are no findings suspicious for malignancy.
IMPRESSION: No mammographic evidence of malignancy. A result letter of this
screening mammogram will be mailed directly to the patient.

RECOMMENDATION:
Screening mammogram in one year. (Code:51-O-LD2)

BI-RADS CATEGORY  1: Negative.

## 2023-10-22 ENCOUNTER — Other Ambulatory Visit: Payer: Self-pay | Admitting: Family Medicine

## 2023-10-29 NOTE — Progress Notes (Unsigned)
 Subjective:  Patient ID: Caroline Kelly, female    DOB: [birth year]  Age: 63 y.o. MRN: [id number]  Chief Complaint  Patient presents with   Medical Management of Chronic Issues   HPI Hypertension: On lisinopril 40 mg twice a day, chlorthalidone 25 mg 1 pill daily as needed if bp is up. Will take carvedilol 12.5 mg once if bp is persistently up despite taking chlorthalidone.      Hypothyroidism: Taking Levothyroxine 25 mg daily.   Hyperlipidemia: Lipitor 40 mg daily. Had to stop it due to joint pain. Improves after a couple of weeks.    GERD: Omeprazole 40 mg daily.     07/18/2023    7:55 AM 04/04/2023    8:02 AM 12/28/2021   10:52 AM 10/11/2021    7:48 AM 11/17/2020    9:25 AM  Depression screen PHQ 2/9  Decreased Interest 0 0 0 1 0  Down, Depressed, Hopeless 0 0 0 1 0  PHQ - 2 Score 0 0 0 2 0  Altered sleeping  1  3   Tired, decreased energy  0  0   Change in appetite  0  1   Feeling bad or failure about yourself   0  0   Trouble concentrating  0  1   Moving slowly or fidgety/restless  0  0   Suicidal thoughts  0  0   PHQ-9 Score  1  7   Difficult doing work/chores  Not difficult at all           07/18/2023    7:54 AM  Fall Risk   Falls in the past year? 0  Number falls in past yr: 0  Injury with Fall? 0  Risk for fall due to : No Fall Risks  Follow up Falls evaluation completed    Patient Care Team: Blane Ohara, MD as PCP - General (Family Medicine) Thomasene Ripple, DO as PCP - Cardiology (Cardiology)   Review of Systems  Constitutional:  Negative for chills, fatigue and fever.  HENT:  Negative for congestion, ear pain, rhinorrhea and sore throat.   Respiratory:  Negative for cough and shortness of breath.   Cardiovascular:  Negative for chest pain.  Gastrointestinal:  Negative for abdominal pain, constipation, diarrhea, nausea and vomiting.  Genitourinary:  Negative for dysuria and urgency.  Musculoskeletal:  Negative for back pain and myalgias.  Neurological:   Negative for dizziness, weakness, light-headedness and headaches.  Psychiatric/Behavioral:  Negative for dysphoric mood. The patient is not nervous/anxious.     Current Outpatient Medications on File Prior to Visit  Medication Sig Dispense Refill   albuterol (VENTOLIN HFA) 108 (90 Base) MCG/ACT inhaler Inhale 2 puffs into the lungs every 6 (six) hours as needed for wheezing or shortness of breath. 8 g 0   chlorthalidone (HYGROTON) 25 MG tablet Take 1 tablet (25 mg total) by mouth daily. 90 tablet 1   Coenzyme Q10 (CO Q 10 PO) Take 1 tablet by mouth daily at 6 (six) AM.     levothyroxine (SYNTHROID) 25 MCG tablet TAKE ONE TABLET BY MOUTH EVERY DAY 90 tablet 0   lisinopril (ZESTRIL) 40 MG tablet TAKE 1 TABLET BY MOUTH TWICE DAILY. 180 tablet 0   loratadine (ALLERGY RELIEF) 10 MG tablet Take 1 tablet (10 mg total) by mouth daily. 90 tablet 1   LORazepam (ATIVAN) 0.5 MG tablet TAKE 1 TABLET BY MOUTH TWICE DAILY AS NEEDED FOR ANXIETY. 20 tablet 1   Probiotic Product (PROBIOTIC-10  PO) Take 1 tablet by mouth daily.     No current facility-administered medications on file prior to visit.   Past Medical History:  Diagnosis Date   Allergic rhinitis    Allergy    GERD (gastroesophageal reflux disease)    Hyperlipidemia    Hypertension    Upper respiratory tract infection due to COVID-19 virus 04/27/2023   Past Surgical History:  Procedure Laterality Date   CESAREAN SECTION     ENDOMETRIAL ABLATION  1989    Family History  Problem Relation Age of Onset   Heart failure Mother    Dementia Mother    Hyperlipidemia Father    Hypertension Father    CAD Father    Hypertension Sister    Hypertension Brother    Breast cancer Neg Hx    Social History   Socioeconomic History   Marital status: Married    Spouse name: Not on file   Number of children: Not on file   Years of education: Not on file   Highest education level: Some college, no degree  Occupational History   Not on file   Tobacco Use   Smoking status: Former    Current packs/day: 0.00    Types: Cigarettes    Quit date: 09/2017    Years since quitting: 6.1   Smokeless tobacco: Never  Substance and Sexual Activity   Alcohol use: Yes    Comment: twice monthly   Drug use: Never   Sexual activity: Not on file  Other Topics Concern   Not on file  Social History Narrative   Not on file   Social Drivers of Health   Financial Resource Strain: Low Risk  (08/16/2023)   Overall Financial Resource Strain (CARDIA)    Difficulty of Paying Living Expenses: Not hard at all  Food Insecurity: No Food Insecurity (08/16/2023)   Hunger Vital Sign    Worried About Running Out of Food in the Last Year: Never true    Ran Out of Food in the Last Year: Never true  Transportation Needs: No Transportation Needs (08/16/2023)   PRAPARE - Administrator, Civil Service (Medical): No    Lack of Transportation (Non-Medical): No  Physical Activity: Inactive (08/16/2023)   Exercise Vital Sign    Days of Exercise per Week: 0 days    Minutes of Exercise per Session: 60 min  Stress: No Stress Concern Present (08/16/2023)   [employer] of Occupational Health - Occupational Stress Questionnaire    Feeling of Stress : Only a little  Social Connections: Moderately Integrated (08/16/2023)   Social Connection and Isolation Panel [NHANES]    Frequency of Communication with Friends and Family: More than three times a week    Frequency of Social Gatherings with Friends and Family: Three times a week    Attends Religious Services: More than 4 times per year    Active Member of Clubs or Organizations: Yes    Attends Banker Meetings: More than 4 times per year    Marital Status: Divorced    Objective:  BP 112/80   Pulse 84   Temp (!) 97.5 F (36.4 C)   Ht 5\' 3"  (1.6 m)   Wt 187 lb (84.8 kg)   SpO2 98%   BMI 33.13 kg/m      10/30/2023    8:37 AM 07/18/2023    7:54 AM 04/26/2023   12:42 PM   BP/Weight  Systolic BP 112 128 139  Diastolic BP 80  74 67  Wt. (Lbs) 187 189 188  BMI 33.13 kg/m2 33.48 kg/m2 33.3 kg/m2    Physical Exam Vitals reviewed.  Constitutional:      Appearance: Normal appearance. She is obese.  Neck:     Vascular: No carotid bruit.  Cardiovascular:     Rate and Rhythm: Normal rate and regular rhythm.     Heart sounds: Normal heart sounds.  Pulmonary:     Effort: Pulmonary effort is normal. No respiratory distress.     Breath sounds: Normal breath sounds.  Abdominal:     General: Abdomen is flat. Bowel sounds are normal.     Palpations: Abdomen is soft.     Tenderness: There is no abdominal tenderness.  Neurological:     Mental Status: She is alert and oriented to person, place, and time.  Psychiatric:        Mood and Affect: Mood normal.        Behavior: Behavior normal.     Diabetic Foot Exam - Simple   No data filed      Lab Results  Component Value Date   WBC 4.4 07/18/2023   HGB 14.6 07/18/2023   HCT 45.1 07/18/2023   PLT 270 07/18/2023   GLUCOSE 94 10/30/2023   CHOL 220 (H) 10/30/2023   TRIG 234 (H) 10/30/2023   HDL 33 (L) 10/30/2023   LDLCALC 144 (H) 10/30/2023   ALT 48 (H) 10/30/2023   AST 43 (H) 10/30/2023   NA 142 10/30/2023   K 4.6 10/30/2023   CL 103 10/30/2023   CREATININE 0.95 10/30/2023   BUN 10 10/30/2023   CO2 25 10/30/2023   TSH 3.770 12/19/2022   HGBA1C 5.5 07/05/2021      Assessment & Plan:    Essential hypertension, benign Assessment & Plan: Not at goal.  Continue lisinopril 40 mg one twice daily, chlorthalidone 25 mg 1 daily. Will take carvedilol 12.5 mg once if bp is persistently up despite taking chlorthalidone.  Check labs. Check bp daily.    Orders: -     Comprehensive metabolic panel  Gastroesophageal reflux disease without esophagitis Assessment & Plan: The current medical regimen is effective;  continue present plan and medications.  Continue omeprazole 40 mg daily.    Acquired  hypothyroidism Assessment & Plan: Previously well controlled Continue Synthroid 25 mg daily    Mixed hyperlipidemia Assessment & Plan: Poorly controlled. Intolerant to lipitor.  Recommend trial on crestor 10 mg before bed and start on vascepa 1 gm 2 capsules twice daily.  Continue to work on eating a healthy diet and exercise.  Labs drawn today.    Orders: -     Lipid panel  Class 1 obesity due to excess calories with serious comorbidity and body mass index (BMI) of 33.0 to 33.9 in adult Assessment & Plan: Recommend continue to work on eating healthy diet and exercise.       No orders of the defined types were placed in this encounter.   Orders Placed This Encounter  Procedures   Comprehensive metabolic panel   Lipid panel     Follow-up: Return in about 3 months (around 01/27/2024) for chronic fasting.   I,Marla I Leal-Borjas,acting as a scribe for Blane Ohara, MD.,have documented all relevant documentation on the behalf of Blane Ohara, MD,as directed by  Blane Ohara, MD while in the presence of Blane Ohara, MD.   An After Visit Summary was printed and given to the patient.  I attest that I have reviewed  this visit and agree with the plan scribed by my staff.   Blane Ohara, MD [facility] [phone]

## 2023-10-30 ENCOUNTER — Encounter: Payer: Self-pay | Admitting: Family Medicine

## 2023-10-30 ENCOUNTER — Ambulatory Visit: Payer: BLUE CROSS/BLUE SHIELD | Admitting: Family Medicine

## 2023-10-30 VITALS — BP 112/80 | HR 84 | Temp 97.5°F | Ht 63.0 in | Wt 187.0 lb

## 2023-10-30 DIAGNOSIS — E782 Mixed hyperlipidemia: Secondary | ICD-10-CM

## 2023-10-30 DIAGNOSIS — E6609 Other obesity due to excess calories: Secondary | ICD-10-CM

## 2023-10-30 DIAGNOSIS — K219 Gastro-esophageal reflux disease without esophagitis: Secondary | ICD-10-CM

## 2023-10-30 DIAGNOSIS — E66811 Obesity, class 1: Secondary | ICD-10-CM

## 2023-10-30 DIAGNOSIS — E039 Hypothyroidism, unspecified: Secondary | ICD-10-CM

## 2023-10-30 DIAGNOSIS — I10 Essential (primary) hypertension: Secondary | ICD-10-CM | POA: Diagnosis not present

## 2023-10-30 DIAGNOSIS — Z6833 Body mass index (BMI) 33.0-33.9, adult: Secondary | ICD-10-CM

## 2023-10-31 LAB — COMPREHENSIVE METABOLIC PANEL
ALT: 48 [IU]/L — ABNORMAL HIGH (ref 0–32)
AST: 43 [IU]/L — ABNORMAL HIGH (ref 0–40)
Albumin: 4.2 g/dL (ref 3.9–4.9)
Alkaline Phosphatase: 80 [IU]/L (ref 44–121)
BUN/Creatinine Ratio: 11 — ABNORMAL LOW (ref 12–28)
BUN: 10 mg/dL (ref 8–27)
Bilirubin Total: 0.5 mg/dL (ref 0.0–1.2)
CO2: 25 mmol/L (ref 20–29)
Calcium: 10.1 mg/dL (ref 8.7–10.3)
Chloride: 103 mmol/L (ref 96–106)
Creatinine, Ser: 0.95 mg/dL (ref 0.57–1.00)
Globulin, Total: 2.3 g/dL (ref 1.5–4.5)
Glucose: 94 mg/dL (ref 70–99)
Potassium: 4.6 mmol/L (ref 3.5–5.2)
Sodium: 142 mmol/L (ref 134–144)
Total Protein: 6.5 g/dL (ref 6.0–8.5)
eGFR: 68 mL/min/{1.73_m2} (ref >59)

## 2023-10-31 LAB — LIPID PANEL
Chol/HDL Ratio: 6.7 {ratio} — ABNORMAL HIGH (ref 0.0–4.4)
Cholesterol, Total: 220 mg/dL — ABNORMAL HIGH (ref 100–199)
HDL: 33 mg/dL — ABNORMAL LOW (ref >39)
LDL Chol Calc (NIH): 144 mg/dL — ABNORMAL HIGH (ref 0–99)
Triglycerides: 234 mg/dL — ABNORMAL HIGH (ref 0–149)
VLDL Cholesterol Cal: 43 mg/dL — ABNORMAL HIGH (ref 5–40)

## 2023-11-02 NOTE — Assessment & Plan Note (Signed)
Previously well controlled Continue Synthroid 25 mg daily

## 2023-11-02 NOTE — Assessment & Plan Note (Signed)
 The current medical regimen is effective;  continue present plan and medications. Continue omeprazole 40 mg daily.

## 2023-11-02 NOTE — Assessment & Plan Note (Signed)
Well controlled.  No changes to medicines. Lipitor 40 mg daily  Continue to work on eating a healthy diet and exercise.  Labs drawn today.   

## 2023-11-02 NOTE — Assessment & Plan Note (Signed)
 Not at goal.  Continue lisinopril 40 mg one twice daily, chlorthalidone 25 mg 1 daily. Will take carvedilol 12.5 mg once if bp is persistently up despite taking chlorthalidone.  Check labs. Check bp daily.

## 2023-11-03 ENCOUNTER — Encounter: Payer: Self-pay | Admitting: Family Medicine

## 2023-11-03 DIAGNOSIS — E66811 Obesity, class 1: Secondary | ICD-10-CM | POA: Insufficient documentation

## 2023-11-03 NOTE — Assessment & Plan Note (Signed)
 Recommend continue to work on eating healthy diet and exercise.

## 2023-12-12 ENCOUNTER — Other Ambulatory Visit: Payer: Self-pay | Admitting: Family Medicine

## 2024-01-17 ENCOUNTER — Other Ambulatory Visit: Payer: Self-pay | Admitting: Family Medicine

## 2024-03-11 ENCOUNTER — Other Ambulatory Visit: Payer: Self-pay | Admitting: Family Medicine

## 2024-04-20 ENCOUNTER — Other Ambulatory Visit: Payer: Self-pay | Admitting: Family Medicine

## 2024-06-01 ENCOUNTER — Other Ambulatory Visit: Payer: Self-pay | Admitting: Family Medicine

## 2024-06-01 MED ORDER — LEVOTHYROXINE SODIUM 25 MCG PO TABS: 25.0000 ug | ORAL_TABLET | Freq: Every day | ORAL | 0 refills | Status: DC

## 2024-06-01 MED ORDER — LISINOPRIL 40 MG PO TABS: 40.0000 mg | ORAL_TABLET | Freq: Two times a day (BID) | ORAL | 0 refills | Status: DC

## 2024-06-01 NOTE — Telephone Encounter (Signed)
 Copied from CRM [id number]. Topic: Clinical - Medication Refill >> Jun 01, 2024 11:35 AM Donna BRAVO wrote: Patient is in [place] , daughter broke her leg and needs help. Patient will be in [place]  until November 8th, 2025  Medication:  lisinopril  (ZESTRIL ) 40 MG tablet levothyroxine (SYNTHROID) 25 MCG   Has the patient contacted their pharmacy? No Patient is in [place]   This is the patient's preferred pharmacy:    [facility] [address] [place] - [address] [place] [address] Phone: [phone] Fax: [phone]  Is this the correct pharmacy for this prescription? Yes If no, delete pharmacy and type the correct one.   Has the prescription been filled recently? Yes  Is the patient out of the medication? No  Has the patient been seen for an appointment in the last year OR does the patient have an upcoming appointment? Yes  Can we respond through MyChart? Yes  Agent: Please be advised that Rx refills may take up to 3 business days. We ask that you follow-up with your pharmacy.

## 2024-07-27 ENCOUNTER — Ambulatory Visit: Payer: Self-pay

## 2024-07-27 NOTE — Telephone Encounter (Signed)
 FYI Only or Action Required?: FYI only for provider: appointment scheduled on 07/28/2024 at 9:40 AM.  Patient was last seen in primary care on 10/30/2023 by Sherre Clapper, MD.  Called Nurse Triage reporting Chest Congestion and Cough.  Symptoms began a week ago.  Interventions attempted: Rest, hydration, or home remedies.  Symptoms are: unchanged.  Triage Disposition: See Physician Within 24 Hours  Patient/caregiver understands and will follow disposition?: Yes  Copied from CRM #[id number]. Topic: Clinical - Red Word Triage >> Jul 27, 2024  4:23 PM Amy B wrote: Red Word that prompted transfer to Nurse Triage: Chest tightness with congestion, wheezing, prone to pneumonia Reason for Disposition  SEVERE coughing spells (e.g., whooping sound after coughing, vomiting after coughing)  Answer Assessment - Initial Assessment Questions 1. ONSET: When did the cough begin?      Started last week 2. SEVERITY: How bad is the cough today?      Mild-moderate 3. SPUTUM: Describe the color of your sputum (e.g., none, dry cough; clear, white, yellow, green)     dry 4. HEMOPTYSIS: Are you coughing up any blood? If Yes, ask: How much? (e.g., flecks, streaks, tablespoons, etc.)     no 5. DIFFICULTY BREATHING: Are you having difficulty breathing? If Yes, ask: How bad is it? (e.g., mild, moderate, severe)      no 6. FEVER: Do you have a fever? If Yes, ask: What is your temperature, how was it measured, and when did it start?     no 7. CARDIAC HISTORY: Do you have any history of heart disease? (e.g., heart attack, congestive heart failure)      no 8. LUNG HISTORY: Do you have any history of lung disease?  (e.g., pulmonary embolus, asthma, emphysema)     yes 9. PE RISK FACTORS: Do you have a history of blood clots? (or: recent major surgery, recent prolonged travel, bedridden)     no 10. OTHER SYMPTOMS: Do you have any other symptoms? (e.g., runny nose, wheezing, chest pain)        Wheezing, chest tightness 12. TRAVEL: Have you traveled out of the country in the last month? (e.g., travel history, exposures)       no  Protocols used: Cough - Acute Non-Productive-A-AH

## 2024-07-28 ENCOUNTER — Ambulatory Visit

## 2024-07-28 VITALS — BP 132/88 | HR 63 | Temp 98.1°F | Ht 63.0 in | Wt 184.4 lb

## 2024-07-28 DIAGNOSIS — J209 Acute bronchitis, unspecified: Secondary | ICD-10-CM | POA: Diagnosis not present

## 2024-07-28 DIAGNOSIS — R059 Cough, unspecified: Secondary | ICD-10-CM | POA: Insufficient documentation

## 2024-07-28 DIAGNOSIS — R062 Wheezing: Secondary | ICD-10-CM | POA: Diagnosis not present

## 2024-07-28 MED ORDER — ALBUTEROL SULFATE HFA 108 (90 BASE) MCG/ACT IN AERS: 2.0000 | INHALATION_SPRAY | Freq: Four times a day (QID) | RESPIRATORY_TRACT | 1 refills | Status: AC | PRN

## 2024-07-28 MED ORDER — AMOXICILLIN-POT CLAVULANATE 875-125 MG PO TABS: 1.0000 | ORAL_TABLET | Freq: Two times a day (BID) | ORAL | 0 refills | Status: AC

## 2024-07-28 MED ORDER — TRIAMCINOLONE ACETONIDE 40 MG/ML IJ SUSP
80.0000 mg | Freq: Once | INTRAMUSCULAR | Status: AC
  Administered 2024-07-28: 80 mg via INTRAMUSCULAR

## 2024-07-28 NOTE — Assessment & Plan Note (Deleted)
 Caroline Kelly

## 2024-07-28 NOTE — Progress Notes (Signed)
 Acute Office Visit  Subjective:    Patient ID: Caroline Kelly, female    DOB: [birth year], 63 y.o.   MRN: [id number]  Chief Complaint  Patient presents with   Cough    HPI: Patient is in today for   Discussed the use of AI scribe software for clinical note transcription with the patient, who gave verbal consent to proceed.  History of Present Illness   Caroline Kelly is a 63 year old female with recurrent pneumonia who presents with cough and chest congestion.  Upper and lower respiratory symptoms - Burning sore throat for approximately one week, followed by onset of cough and chest congestion - Cough and chest congestion began after exposure to sick grandchildren (ages three and four) who had cough and chest symptoms - Symptoms worsen when lying down, requiring her to prop herself up in bed at night - No use of over-the-counter medications for current symptoms - No recent use of Ventolin  inhaler or loratadine , though both are available at home  Fever and constitutional symptoms - Slight fever last night, described as 'a hundred point something' - One episode of waking up soaking wet at night, consistent with a fever breaking during sleep - Increasing malaise since the past weekend, described as feeling 'cruddy'  History of recurrent pneumonia and covid-19 - Multiple episodes of pneumonia since 2019, often following viral illnesses or COVID-19 infection - Typically requires two rounds of antibiotics to resolve pneumonia - Previous treatment with Kenalog  injections for pneumonia - Prefers to avoid oral steroids such as prednisone  - No episodes of pneumonia in the past year, which is unusual for her - History of multiple COVID-19 infections, often leading to pneumonia and affecting blood pressure  Tobacco use - History of smoking, quit in 2019  Hypertension - Currently taking lisinopril  for blood pressure management - Blood pressure is well-controlled        Past  Medical History:  Diagnosis Date   Allergic rhinitis    Allergy    GERD (gastroesophageal reflux disease)    Hyperlipidemia    Hypertension    Upper respiratory tract infection due to COVID-19 virus 04/27/2023    Past Surgical History:  Procedure Laterality Date   CESAREAN SECTION     ENDOMETRIAL ABLATION  1989    Family History  Problem Relation Age of Onset   Heart failure Mother    Dementia Mother    Hyperlipidemia Father    Hypertension Father    CAD Father    Hypertension Sister    Hypertension Brother    Breast cancer Neg Hx     Social History   Socioeconomic History   Marital status: Married    Spouse name: Not on file   Number of children: Not on file   Years of education: Not on file   Highest education level: Some college, no degree  Occupational History   Not on file  Tobacco Use   Smoking status: Former    Current packs/day: 0.00    Types: Cigarettes    Quit date: 09/2017    Years since quitting: 6.9   Smokeless tobacco: Never  Substance and Sexual Activity   Alcohol use: Yes    Comment: twice monthly   Drug use: Never   Sexual activity: Not on file  Other Topics Concern   Not on file  Social History Narrative   Not on file   Social Drivers of Health   Financial Resource Strain: Low Risk  (08/16/2023)  Overall Financial Resource Strain (CARDIA)    Difficulty of Paying Living Expenses: Not hard at all  Food Insecurity: No Food Insecurity (08/16/2023)   Hunger Vital Sign    Worried About Running Out of Food in the Last Year: Never true    Ran Out of Food in the Last Year: Never true  Transportation Needs: No Transportation Needs (08/16/2023)   PRAPARE - Administrator, Civil Service (Medical): No    Lack of Transportation (Non-Medical): No  Physical Activity: Inactive (08/16/2023)   Exercise Vital Sign    Days of Exercise per Week: 0 days    Minutes of Exercise per Session: 60 min  Stress: No Stress Concern Present  (08/16/2023)   [employer] of Occupational Health - Occupational Stress Questionnaire    Feeling of Stress : Only a little  Social Connections: Moderately Integrated (08/16/2023)   Social Connection and Isolation Panel    Frequency of Communication with Friends and Family: More than three times a week    Frequency of Social Gatherings with Friends and Family: Three times a week    Attends Religious Services: More than 4 times per year    Active Member of Clubs or Organizations: Yes    Attends Banker Meetings: More than 4 times per year    Marital Status: Divorced  Intimate Partner Violence: Not At Risk (07/18/2023)   Humiliation, Afraid, Rape, and Kick questionnaire    Fear of Current or Ex-Partner: No    Emotionally Abused: No    Physically Abused: No    Sexually Abused: No    Outpatient Medications Prior to Visit  Medication Sig Dispense Refill   chlorthalidone  (HYGROTON ) 25 MG tablet Take 1 tablet (25 mg total) by mouth daily. (Patient taking differently: Take 25 mg by mouth as needed.) 90 tablet 1   levothyroxine  (SYNTHROID ) 25 MCG tablet Take 1 tablet (25 mcg total) by mouth daily. 90 tablet 0   lisinopril  (ZESTRIL ) 40 MG tablet Take 1 tablet (40 mg total) by mouth 2 (two) times daily. 180 tablet 0   Probiotic Product (PROBIOTIC-10 PO) Take 1 tablet by mouth daily.     albuterol  (VENTOLIN  HFA) 108 (90 Base) MCG/ACT inhaler Inhale 2 puffs into the lungs every 6 (six) hours as needed for wheezing or shortness of breath. 8 g 0   Coenzyme Q10 (CO Q 10 PO) Take 1 tablet by mouth daily at 6 (six) AM. (Patient not taking: Reported on 07/28/2024)     loratadine  (ALLERGY RELIEF) 10 MG tablet Take 1 tablet (10 mg total) by mouth daily. (Patient not taking: Reported on 07/28/2024) 90 tablet 1   LORazepam  (ATIVAN ) 0.5 MG tablet TAKE 1 TABLET BY MOUTH TWICE DAILY AS NEEDED FOR ANXIETY. (Patient not taking: Reported on 07/28/2024) 20 tablet 1   No facility-administered  medications prior to visit.    Allergies  Allergen Reactions   Levofloxacin  Hives   Atorvastatin  Other (See Comments)    Muscle pain   Latex Rash    Review of Systems  Constitutional:  Positive for fever. Negative for chills and fatigue.  HENT:  Positive for sore throat and voice change. Negative for congestion, ear pain and sinus pain.   Respiratory:  Positive for cough and wheezing. Negative for shortness of breath.   Cardiovascular:  Negative for chest pain.  Gastrointestinal:  Positive for diarrhea. Negative for abdominal pain, constipation, nausea and vomiting.  Musculoskeletal:  Negative for myalgias.  Neurological:  Negative for headaches.  Objective:        07/28/2024    9:30 AM 10/30/2023    8:37 AM 07/18/2023    7:54 AM  Vitals with BMI  Height 5' 3 5' 3 5' 3  Weight 184 lbs 6 oz 187 lbs 189 lbs  BMI 32.67 33.13 33.49  Systolic 132 112 871  Diastolic 88 80 74  Pulse 63 84 60    No data found.   Physical Exam Vitals and nursing note reviewed.  HENT:     Head: Normocephalic and atraumatic.     Ears:     Comments: Dull TM on right     Nose: Congestion present.     Mouth/Throat:     Comments: Post nasal drainage noted Eyes:     Pupils: Pupils are equal, round, and reactive to light.  Cardiovascular:     Rate and Rhythm: Normal rate and regular rhythm.  Pulmonary:     Breath sounds: Wheezing and rhonchi present.  Musculoskeletal:        General: Normal range of motion.  Skin:    General: Skin is warm.  Neurological:     General: No focal deficit present.     Mental Status: She is alert and oriented to person, place, and time.  Psychiatric:        Mood and Affect: Mood normal.     Health Maintenance Due  Topic Date Due   Colonoscopy  Never done   Mammogram  12/28/2022   Influenza Vaccine  04/03/2024    There are no preventive care reminders to display for this patient.   Lab Results  Component Value Date   TSH 3.770  12/19/2022   Lab Results  Component Value Date   WBC 4.4 07/18/2023   HGB 14.6 07/18/2023   HCT 45.1 07/18/2023   MCV 92 07/18/2023   PLT 270 07/18/2023   Lab Results  Component Value Date   NA 142 10/30/2023   K 4.6 10/30/2023   CO2 25 10/30/2023   GLUCOSE 94 10/30/2023   BUN 10 10/30/2023   CREATININE 0.95 10/30/2023   BILITOT 0.5 10/30/2023   ALKPHOS 80 10/30/2023   AST 43 (H) 10/30/2023   ALT 48 (H) 10/30/2023   PROT 6.5 10/30/2023   ALBUMIN 4.2 10/30/2023   CALCIUM  10.1 10/30/2023   EGFR 68 10/30/2023   Lab Results  Component Value Date   CHOL 220 (H) 10/30/2023   Lab Results  Component Value Date   HDL 33 (L) 10/30/2023   Lab Results  Component Value Date   LDLCALC 144 (H) 10/30/2023   Lab Results  Component Value Date   TRIG 234 (H) 10/30/2023   Lab Results  Component Value Date   CHOLHDL 6.7 (H) 10/30/2023   Lab Results  Component Value Date   HGBA1C 5.5 07/05/2021        Results for orders placed or performed in visit on 10/30/23  Comprehensive metabolic panel   Collection Time: 10/30/23  9:10 AM  Result Value Ref Range   Glucose 94 70 - 99 mg/dL   BUN 10 8 - 27 mg/dL   Creatinine, Ser 9.04 0.57 - 1.00 mg/dL   eGFR 68 >40 fO/fpw/8.26   BUN/Creatinine Ratio 11 (L) 12 - 28   Sodium 142 134 - 144 mmol/L   Potassium 4.6 3.5 - 5.2 mmol/L   Chloride 103 96 - 106 mmol/L   CO2 25 20 - 29 mmol/L   Calcium  10.1 8.7 - 10.3 mg/dL  Total Protein 6.5 6.0 - 8.5 g/dL   Albumin 4.2 3.9 - 4.9 g/dL   Globulin, Total 2.3 1.5 - 4.5 g/dL   Bilirubin Total 0.5 0.0 - 1.2 mg/dL   Alkaline Phosphatase 80 44 - 121 IU/L   AST 43 (H) 0 - 40 IU/L   ALT 48 (H) 0 - 32 IU/L  Lipid panel   Collection Time: 10/30/23  9:10 AM  Result Value Ref Range   Cholesterol, Total 220 (H) 100 - 199 mg/dL   Triglycerides 765 (H) 0 - 149 mg/dL   HDL 33 (L) >60 mg/dL   VLDL Cholesterol Cal 43 (H) 5 - 40 mg/dL   LDL Chol Calc (NIH) 855 (H) 0 - 99 mg/dL   Chol/HDL Ratio 6.7  (H) 0.0 - 4.4 ratio     Assessment & Plan:   Assessment & Plan Bilateral wheezing Appears to be acute bronchitis. Patient concerned about potential pneumonia given history of recurrent pneumonia. Prescribed Augmentin .     Cough Orders:   albuterol  (VENTOLIN  HFA) 108 (90 Base) MCG/ACT inhaler; Inhale 2 puffs into the lungs every 6 (six) hours as needed for wheezing or shortness of breath.  Acute bronchitis, unspecified organism  Acute bronchospasm with cough and wheezing Acute bronchospasm with cough and wheezing, likely secondary to post-nasal drainage. Wheezing present in both lungs. No signs of pneumonia on examination. History of recurrent pneumonia and smoking, but no current evidence of COPD. Prefers to avoid oral prednisone , opting for a Kenalog  shot instead. Augmentin  prescribed as a precautionary measure against potential bacterial infection. - Administered Kenalog  80 mg shot IM in office - Refilled Ventolin  inhaler and instructed to use regularly. - Prescribed Augmentin  as a precautionary measure against potential bacterial infection. - Advised to use Mucinex  (non-DM) to help clear secretions. - Encouraged increased fluid intake. - Instructed to monitor symptoms and obtain chest x-ray if symptoms worsen or if shortness of breath develops.  Hypertension Well-controlled with lisinopril . No current issues with blood pressure management. - Continue current lisinopril  regimen.        Body mass index is 32.66 kg/m..  Meds ordered this encounter  Medications   albuterol  (VENTOLIN  HFA) 108 (90 Base) MCG/ACT inhaler    Sig: Inhale 2 puffs into the lungs every 6 (six) hours as needed for wheezing or shortness of breath.    Dispense:  8 g    Refill:  1   amoxicillin -clavulanate (AUGMENTIN ) 875-125 MG tablet    Sig: Take 1 tablet by mouth 2 (two) times daily for 7 days.    Dispense:  14 tablet    Refill:  0    No orders of the defined types were placed in this encounter.     Follow-up: No follow-ups on file.  An After Visit Summary was printed and given to the patient.  Daven Montz, MD [facility] [phone]

## 2024-07-28 NOTE — Addendum Note (Signed)
 Addended by: Mabell Esguerra M on: 07/28/2024 10:31 AM   Modules accepted: Orders

## 2024-07-28 NOTE — Assessment & Plan Note (Addendum)
  Orders:   albuterol  (VENTOLIN  HFA) 108 (90 Base) MCG/ACT inhaler; Inhale 2 puffs into the lungs every 6 (six) hours as needed for wheezing or shortness of breath.

## 2024-07-28 NOTE — Assessment & Plan Note (Addendum)
 Appears to be acute bronchitis. Patient concerned about potential pneumonia given history of recurrent pneumonia. Prescribed Augmentin .

## 2024-08-17 ENCOUNTER — Ambulatory Visit: Payer: Self-pay

## 2024-08-17 NOTE — Telephone Encounter (Signed)
 FYI Only or Action Required?: FYI only for provider: appointment scheduled on 08/18/2024 (virtual per pt request).  Patient was last seen in primary care on 07/28/2024 by Sirivol, Mamatha, MD.  Called Nurse Triage reporting Cough.  Symptoms began several days ago.  Triage Disposition: See PCP When Office is Open (Within 3 Days)  Patient/caregiver understands and will follow disposition?: Yes            Copied from CRM #[id number]. Topic: Clinical - Red Word Triage >> Aug 17, 2024  9:15 AM Rea ORN wrote: Red Word that prompted transfer to Nurse Triage: Dry tight and painful cough, sinus pain behind eyes.  Pt was recently treated for bronchitis 11/25 and given abx. When abx was finished, pt stated sx returned. Reason for Disposition  [1] Sinus congestion (pressure, fullness) AND [2] present > 10 days  Answer Assessment - Initial Assessment Questions Pt had bronchitis and was given abx on 11/25  Onset: last Tuesday symptoms returned, progressively worsening  Symptoms: Dry cough, sore throat, sinus pressure, runny nose/sneezing, ear congestion   Denies: fever (pt thinks she had one intermittently), difficulty breathing  Protocols used: Sinus Pain or Congestion-A-AH

## 2024-08-18 ENCOUNTER — Telehealth

## 2024-08-18 VITALS — Ht 64.0 in | Wt 184.0 lb

## 2024-08-18 DIAGNOSIS — R053 Chronic cough: Secondary | ICD-10-CM

## 2024-08-18 MED ORDER — AMOXICILLIN-POT CLAVULANATE 875-125 MG PO TABS: 1.0000 | ORAL_TABLET | Freq: Two times a day (BID) | ORAL | 0 refills | Status: AC

## 2024-08-18 NOTE — Assessment & Plan Note (Addendum)
 Persistent cough for a little more than a week,  following recent treatment with Augmentin  back on 07/28/24/. Symptoms suggest a viral etiology, as 95% of bronchitis cases are viral.   Tried educating the patient that Recent antibiotic use raises concern for resistance if another course is initiated without evidence of bacterial infection.   Explained to her that Cough is the last symptom to resolve, and supportive care is recommended.  But as she insisted she will only get better with antibiotics and wanted to try Augmentin  again,  Prescribed Augmentin  for potential evolution into bacterial infection, advised to hold off unless symptoms worsen or do not improve. - Recommended supportive care including salt water gargling, steam inhalation, and use of DayQuil or NyQuil for symptom relief. - Continue Mucinex  for expectoration. - Consider Tessalon for cough suppression if needed. - Advised chest x-ray and only start antibiotics if she has abnormal chest x ray but she mentions she does not know if her insurance covers a chest x ray and does not know where to go to do it due to recent insurance changes.

## 2024-08-18 NOTE — Progress Notes (Signed)
 Virtual Visit via Video Note   This visit type was conducted per patient request This format is felt to be appropriate for this patient at this time.  All issues noted in this document were discussed and addressed.  A limited physical exam was performed with this format.  A verbal consent was obtained for the virtual visit.   Date:  08/18/2024   ID:  Caroline Kelly, DOB 05-Jan-1961, MRN [id number]  Patient Location: Home Provider Location: Office/Clinic  PCP:  Sherre Clapper, MD   Chief Complaint  Patient presents with   Sinusitis     History of Present Illness:    Discussed the use of AI scribe software for clinical note transcription with the patient, who gave verbal consent to proceed.  Discussed the use of AI scribe software for clinical note transcription with the patient, who gave verbal consent to proceed.  History of Present Illness   Caroline Kelly is a 63 year old female with recurrent pneumonia who presents with persistent cough and chest congestion.  Cough and chest congestion - had an episode of cough and chest congestion  for a week or so prior to July 28, 2024, was seen here in office, was given augmentin , kenalog  shot in office.  States she got better then and then got sick again. - Cough is worse at certain times of the day and has not fully resolved - No fever or thick sputum - Cough is bothersome and impacts daily activities - Mucinex  used to aid expectoration, increases coughing but helps clear drainage  Recent treatments and medication history - Recent course of Augmentin  completed for burning sore throat - Received albuterol  inhaler and Kenalog  injection at last visit on 07/28/24 - Continues to use albuterol  inhaler - Allergy to levofloxacin , limiting antibiotic options         Past Medical History:  Diagnosis Date   Allergic rhinitis    Allergy    GERD (gastroesophageal reflux disease)    Hyperlipidemia    Hypertension    Upper  respiratory tract infection due to COVID-19 virus 04/27/2023    Past Surgical History:  Procedure Laterality Date   CESAREAN SECTION     ENDOMETRIAL ABLATION  1989    Family History  Problem Relation Age of Onset   Heart failure Mother    Dementia Mother    Hyperlipidemia Father    Hypertension Father    CAD Father    Hypertension Sister    Hypertension Brother    Breast cancer Neg Hx     Social History   Socioeconomic History   Marital status: Married    Spouse name: Not on file   Number of children: Not on file   Years of education: Not on file   Highest education level: Some college, no degree  Occupational History   Not on file  Tobacco Use   Smoking status: Former    Current packs/day: 0.00    Average packs/day: 0.5 packs/day    Types: Cigarettes    Quit date: 09/2017    Years since quitting: 6.9   Smokeless tobacco: Never  Substance and Sexual Activity   Alcohol use: Yes    Comment: twice monthly   Drug use: Never   Sexual activity: Not on file  Other Topics Concern   Not on file  Social History Narrative   Not on file   Social Drivers of Health   Tobacco Use: Medium Risk (08/18/2024)   Patient History  Smoking Tobacco Use: Former    Smokeless Tobacco Use: Never    Passive Exposure: Not on file  Financial Resource Strain: Low Risk (08/16/2023)   Overall Financial Resource Strain (CARDIA)    Difficulty of Paying Living Expenses: Not hard at all  Food Insecurity: No Food Insecurity (08/16/2023)   Hunger Vital Sign    Worried About Running Out of Food in the Last Year: Never true    Ran Out of Food in the Last Year: Never true  Transportation Needs: No Transportation Needs (08/16/2023)   PRAPARE - Administrator, Civil Service (Medical): No    Lack of Transportation (Non-Medical): No  Physical Activity: Inactive (08/16/2023)   Exercise Vital Sign    Days of Exercise per Week: 0 days    Minutes of Exercise per Session: 60 min   Stress: No Stress Concern Present (08/16/2023)   [employer] of Occupational Health - Occupational Stress Questionnaire    Feeling of Stress : Only a little  Social Connections: Moderately Integrated (08/16/2023)   Social Connection and Isolation Panel    Frequency of Communication with Friends and Family: More than three times a week    Frequency of Social Gatherings with Friends and Family: Three times a week    Attends Religious Services: More than 4 times per year    Active Member of Clubs or Organizations: Yes    Attends Banker Meetings: More than 4 times per year    Marital Status: Divorced  Intimate Partner Violence: Not At Risk (07/18/2023)   Humiliation, Afraid, Rape, and Kick questionnaire    Fear of Current or Ex-Partner: No    Emotionally Abused: No    Physically Abused: No    Sexually Abused: No  Depression (PHQ2-9): Low Risk (07/28/2024)   Depression (PHQ2-9)    PHQ-2 Score: 0  Alcohol Screen: Low Risk (08/16/2023)   Alcohol Screen    Last Alcohol Screening Score (AUDIT): 1  Housing: Low Risk (08/16/2023)   Housing Stability Vital Sign    Unable to Pay for Housing in the Last Year: No    Number of Times Moved in the Last Year: 0    Homeless in the Last Year: No  Utilities: Not At Risk (07/18/2023)   AHC Utilities    Threatened with loss of utilities: No  Health Literacy: Adequate Health Literacy (07/18/2023)   B1300 Health Literacy    Frequency of need for help with medical instructions: Never    Outpatient Medications Prior to Visit  Medication Sig Dispense Refill   albuterol  (VENTOLIN  HFA) 108 (90 Base) MCG/ACT inhaler Inhale 2 puffs into the lungs every 6 (six) hours as needed for wheezing or shortness of breath. 8 g 1   Ascorbic Acid (VITAMIN C ER PO) Take 1 tablet by mouth daily.     B Complex-Folic Acid (B COMPLEX-VITAMIN B12 PO) Take 1 tablet by mouth daily.     Coenzyme Q10 (CO Q 10 PO) Take 1 tablet by mouth daily.      levothyroxine  (SYNTHROID ) 25 MCG tablet Take 1 tablet (25 mcg total) by mouth daily. 90 tablet 0   lisinopril  (ZESTRIL ) 40 MG tablet Take 1 tablet (40 mg total) by mouth 2 (two) times daily. 180 tablet 0   Probiotic Product (PROBIOTIC-10 PO) Take 1 tablet by mouth daily.     Coenzyme Q10 (CO Q 10 PO) Take 1 tablet by mouth daily at 6 (six) AM.     chlorthalidone  (HYGROTON ) 25 MG tablet  Take 1 tablet (25 mg total) by mouth daily. (Patient taking differently: Take 25 mg by mouth as needed.) 90 tablet 1   loratadine  (ALLERGY RELIEF) 10 MG tablet Take 1 tablet (10 mg total) by mouth daily. (Patient not taking: Reported on 07/28/2024) 90 tablet 1   No facility-administered medications prior to visit.    Allergies[1]   Social History[2]   Review of Systems  Constitutional: Negative.   HENT:  Positive for sore throat.   Eyes: Negative.   Respiratory:  Positive for cough.   Cardiovascular: Negative.   Gastrointestinal: Negative.   Genitourinary: Negative.   Musculoskeletal: Negative.   Skin: Negative.   Neurological: Negative.   Psychiatric/Behavioral: Negative.    All other systems reviewed and are negative.    Labs/Other Tests and Data Reviewed:    Recent Labs: 10/30/2023: ALT 48; BUN 10; Creatinine, Ser 0.95; Potassium 4.6; Sodium 142   Recent Lipid Panel Lab Results  Component Value Date/Time   CHOL 220 (H) 10/30/2023 09:10 AM   TRIG 234 (H) 10/30/2023 09:10 AM   HDL 33 (L) 10/30/2023 09:10 AM   CHOLHDL 6.7 (H) 10/30/2023 09:10 AM   LDLCALC 144 (H) 10/30/2023 09:10 AM    Wt Readings from Last 3 Encounters:  08/18/24 184 lb (83.5 kg)  07/28/24 184 lb 6.4 oz (83.6 kg)  10/30/23 187 lb (84.8 kg)     Objective:    Vital Signs:  Ht 5' 4 (1.626 m)   Wt 184 lb (83.5 kg)   BMI 31.58 kg/m    Physical Exam Vitals and nursing note reviewed.  Neurological:     Mental Status: She is alert.      ASSESSMENT & PLAN:   Persistent cough Assessment & Plan: Persistent cough  for a little more than a week,  following recent treatment with Augmentin  back on 07/28/24/. Symptoms suggest a viral etiology, as 95% of bronchitis cases are viral.   Tried educating the patient that Recent antibiotic use raises concern for resistance if another course is initiated without evidence of bacterial infection.   Explained to her that Cough is the last symptom to resolve, and supportive care is recommended.  But as she insisted she will only get better with antibiotics and wanted to try Augmentin  again,  Prescribed Augmentin  for potential evolution into bacterial infection, advised to hold off unless symptoms worsen or do not improve. - Recommended supportive care including salt water gargling, steam inhalation, and use of DayQuil or NyQuil for symptom relief. - Continue Mucinex  for expectoration. - Consider Tessalon for cough suppression if needed. - Advised chest x-ray and only start antibiotics if she has abnormal chest x ray but she mentions she does not know if her insurance covers a chest x ray and does not know where to go to do it due to recent insurance changes.      Other orders -     Amoxicillin -Pot Clavulanate; Take 1 tablet by mouth 2 (two) times daily for 7 days.  Dispense: 14 tablet; Refill: 0    I personally spent a total of 25 minutes in the care of the patient today including counseling and educating and placing orders.  No orders of the defined types were placed in this encounter.    Meds ordered this encounter  Medications   amoxicillin -clavulanate (AUGMENTIN ) 875-125 MG tablet    Sig: Take 1 tablet by mouth 2 (two) times daily for 7 days.    Dispense:  14 tablet    Refill:  0  Follow Up:  In Person prn  Signed, Mamatha Sirivol, MD  08/18/2024 8:41 AM    [facility]       [1]  Allergies Allergen Reactions   Levofloxacin  Hives   Atorvastatin  Other (See Comments)    Muscle pain   Latex Rash  [2]  Social  History Tobacco Use   Smoking status: Former    Current packs/day: 0.00    Average packs/day: 0.5 packs/day    Types: Cigarettes    Quit date: 09/2017    Years since quitting: 6.9   Smokeless tobacco: Never  Substance Use Topics   Alcohol use: Yes    Comment: twice monthly   Drug use: Never

## 2024-08-26 ENCOUNTER — Other Ambulatory Visit: Payer: Self-pay | Admitting: Family Medicine

## 2024-09-10 ENCOUNTER — Other Ambulatory Visit: Payer: Self-pay | Admitting: Family Medicine

## 2024-09-10 MED ORDER — LISINOPRIL 40 MG PO TABS: 40.0000 mg | ORAL_TABLET | Freq: Two times a day (BID) | ORAL | 0 refills | Status: AC

## 2024-09-10 NOTE — Telephone Encounter (Signed)
 Copied from CRM #[id number]. Topic: Clinical - Medication Refill >> Sep 10, 2024  8:31 AM Gustabo D wrote: Medication:  lisinopril  (ZESTRIL ) 40 MG tablet   Has the patient contacted their pharmacy? No (Agent: If no, request that the patient contact the pharmacy for the refill. If patient does not wish to contact the pharmacy document the reason why and proceed with request.) (Agent: If yes, when and what did the pharmacy advise?)  This is the patient's preferred pharmacy:  [address] [place] [place] - [address] [place] [address] Phone: [phone] Fax: [phone]    Is this the correct pharmacy for this prescription? Yes If no, delete pharmacy and type the correct one.   Has the prescription been filled recently? Yes  Is the patient out of the medication? Yes  Has the patient been seen for an appointment in the last year OR does the patient have an upcoming appointment? Yes  Can we respond through MyChart? Yes prefers a text message  Agent: Please be advised that Rx refills may take up to 3 business days. We ask that you follow-up with your pharmacy. >> Sep 10, 2024  8:32 AM Gustabo D wrote: Pt is no longer in [place]  and needs her prescription sent to the pharmacy listed

## 2024-09-22 ENCOUNTER — Other Ambulatory Visit: Payer: Self-pay | Admitting: Family Medicine

## 2024-10-28 ENCOUNTER — Encounter: Admitting: Family Medicine
